# Patient Record
Sex: Female | Born: 1990 | Race: White | Hispanic: No | Marital: Single | Smoking: Never smoker
Health system: Southern US, Community
[De-identification: ages and names within clinical notes are randomized; demographics above are authoritative.]

## PROBLEM LIST (undated history)

## (undated) DIAGNOSIS — J45909 Unspecified asthma, uncomplicated: Secondary | ICD-10-CM

## (undated) DIAGNOSIS — F419 Anxiety disorder, unspecified: Secondary | ICD-10-CM

## (undated) DIAGNOSIS — N83209 Unspecified ovarian cyst, unspecified side: Secondary | ICD-10-CM

## (undated) HISTORY — DX: Anxiety disorder, unspecified: F41.9

## (undated) HISTORY — PX: TONSILLECTOMY: SUR1361

## (undated) HISTORY — DX: Unspecified ovarian cyst, unspecified side: N83.209

## (undated) HISTORY — PX: WISDOM TOOTH EXTRACTION: SHX21

## (undated) HISTORY — DX: Unspecified asthma, uncomplicated: J45.909

---

## 2008-07-16 ENCOUNTER — Emergency Department (HOSPITAL_COMMUNITY): Admission: EM | Admit: 2008-07-16 | Discharge: 2008-07-16 | Payer: Self-pay | Admitting: Emergency Medicine

## 2010-10-08 ENCOUNTER — Emergency Department (HOSPITAL_COMMUNITY)
Admission: EM | Admit: 2010-10-08 | Discharge: 2010-10-08 | Disposition: A | Discharge status: Home / Self Care | Payer: PRIVATE HEALTH INSURANCE | Attending: Emergency Medicine | Admitting: Emergency Medicine

## 2010-10-08 DIAGNOSIS — M545 Low back pain, unspecified: Secondary | ICD-10-CM | POA: Insufficient documentation

## 2010-10-08 DIAGNOSIS — N390 Urinary tract infection, site not specified: Secondary | ICD-10-CM | POA: Insufficient documentation

## 2010-10-08 LAB — URINE MICROSCOPIC-ADD ON

## 2010-10-08 LAB — URINALYSIS, ROUTINE W REFLEX MICROSCOPIC
Nitrite: NEGATIVE
Protein, ur: 30 mg/dL — AB
Specific Gravity, Urine: 1.009 (ref 1.005–1.030)
Urobilinogen, UA: 1 mg/dL (ref 0.0–1.0)

## 2010-10-08 LAB — PREGNANCY, URINE: Preg Test, Ur: NEGATIVE

## 2010-10-10 ENCOUNTER — Emergency Department (HOSPITAL_COMMUNITY)
Admission: EM | Admit: 2010-10-10 | Discharge: 2010-10-11 | Disposition: A | Discharge status: Home / Self Care | Payer: PRIVATE HEALTH INSURANCE | Attending: Emergency Medicine | Admitting: Emergency Medicine

## 2010-10-10 DIAGNOSIS — R82998 Other abnormal findings in urine: Secondary | ICD-10-CM | POA: Insufficient documentation

## 2010-10-10 DIAGNOSIS — R51 Headache: Secondary | ICD-10-CM | POA: Insufficient documentation

## 2010-10-10 DIAGNOSIS — N390 Urinary tract infection, site not specified: Secondary | ICD-10-CM | POA: Insufficient documentation

## 2010-10-11 LAB — CBC
HCT: 32.2 % — ABNORMAL LOW (ref 36.0–46.0)
Hemoglobin: 11.1 g/dL — ABNORMAL LOW (ref 12.0–15.0)
RBC: 3.39 MIL/uL — ABNORMAL LOW (ref 3.87–5.11)
RDW: 12.6 % (ref 11.5–15.5)
WBC: 15.2 10*3/uL — ABNORMAL HIGH (ref 4.0–10.5)

## 2010-10-11 LAB — URINALYSIS, ROUTINE W REFLEX MICROSCOPIC
Glucose, UA: NEGATIVE mg/dL
Glucose, UA: NEGATIVE mg/dL
Ketones, ur: NEGATIVE mg/dL
Protein, ur: NEGATIVE mg/dL
Specific Gravity, Urine: 1.022 (ref 1.005–1.030)
Urobilinogen, UA: 4 mg/dL — ABNORMAL HIGH (ref 0.0–1.0)
pH: 6 (ref 5.0–8.0)

## 2010-10-11 LAB — URINE MICROSCOPIC-ADD ON

## 2010-10-11 LAB — DIFFERENTIAL
Eosinophils Relative: 1 % (ref 0–5)
Lymphocytes Relative: 7 % — ABNORMAL LOW (ref 12–46)
Lymphs Abs: 1.1 10*3/uL (ref 0.7–4.0)
Monocytes Absolute: 1.3 10*3/uL — ABNORMAL HIGH (ref 0.1–1.0)
Monocytes Relative: 9 % (ref 3–12)
Neutro Abs: 12.6 10*3/uL — ABNORMAL HIGH (ref 1.7–7.7)

## 2010-10-11 LAB — POCT PREGNANCY, URINE: Preg Test, Ur: NEGATIVE

## 2010-10-12 LAB — URINE CULTURE: Colony Count: >=100000

## 2014-11-12 ENCOUNTER — Other Ambulatory Visit: Payer: Self-pay | Admitting: Obstetrics & Gynecology

## 2014-11-12 ENCOUNTER — Other Ambulatory Visit (HOSPITAL_COMMUNITY)
Admission: RE | Admit: 2014-11-12 | Discharge: 2014-11-12 | Disposition: A | Discharge status: Home / Self Care | Payer: BC Managed Care – PPO | Source: Ambulatory Visit | Attending: Obstetrics and Gynecology | Admitting: Obstetrics and Gynecology

## 2014-11-12 DIAGNOSIS — Z113 Encounter for screening for infections with a predominantly sexual mode of transmission: Secondary | ICD-10-CM | POA: Diagnosis present

## 2014-11-12 DIAGNOSIS — Z01419 Encounter for gynecological examination (general) (routine) without abnormal findings: Secondary | ICD-10-CM | POA: Insufficient documentation

## 2014-11-18 LAB — CYTOLOGY - PAP

## 2020-09-15 LAB — HM PAP SMEAR: HPV, high-risk: NEGATIVE

## 2021-06-24 ENCOUNTER — Other Ambulatory Visit: Payer: Self-pay

## 2021-06-24 ENCOUNTER — Encounter: Payer: Self-pay | Admitting: Family Medicine

## 2021-06-24 ENCOUNTER — Ambulatory Visit (INDEPENDENT_AMBULATORY_CARE_PROVIDER_SITE_OTHER): Payer: BC Managed Care – PPO | Admitting: Family Medicine

## 2021-06-24 VITALS — BP 120/79 | HR 90 | Temp 97.8°F | Ht 66.14 in | Wt 218.6 lb

## 2021-06-24 DIAGNOSIS — M5416 Radiculopathy, lumbar region: Secondary | ICD-10-CM | POA: Insufficient documentation

## 2021-06-24 DIAGNOSIS — G43109 Migraine with aura, not intractable, without status migrainosus: Secondary | ICD-10-CM | POA: Insufficient documentation

## 2021-06-24 DIAGNOSIS — M5442 Lumbago with sciatica, left side: Secondary | ICD-10-CM | POA: Diagnosis not present

## 2021-06-24 DIAGNOSIS — F411 Generalized anxiety disorder: Secondary | ICD-10-CM

## 2021-06-24 DIAGNOSIS — J302 Other seasonal allergic rhinitis: Secondary | ICD-10-CM | POA: Insufficient documentation

## 2021-06-24 DIAGNOSIS — N912 Amenorrhea, unspecified: Secondary | ICD-10-CM | POA: Insufficient documentation

## 2021-06-24 DIAGNOSIS — F419 Anxiety disorder, unspecified: Secondary | ICD-10-CM | POA: Insufficient documentation

## 2021-06-24 DIAGNOSIS — R5383 Other fatigue: Secondary | ICD-10-CM | POA: Diagnosis not present

## 2021-06-24 NOTE — Patient Instructions (Addendum)
[facility] in [address] [facility] in [place] [facility] [place] for sleep/anxiety.  We'll be in touch with lab results.

## 2021-06-24 NOTE — Assessment & Plan Note (Signed)
Labs per orders. Orders Placed This Encounter  Procedures   COMPLETE METABOLIC PANEL WITH GFR   CBC with Differential   TSH + free T4   B12   Vitamin D (25 hydroxy)   Iron, TIBC and Ferritin Panel   Ambulatory referral to Psychology    Referral Priority:   Routine    Referral Type:   Psychiatric    Referral Reason:   Specialty Services Required    Requested Specialty:   Psychology    Number of Visits Requested:   1

## 2021-06-24 NOTE — Progress Notes (Signed)
Joy Rogers - 30 y.o. female MRN [id number]  Date of birth: [birth year]  Subjective Chief Complaint  Patient presents with   Establish Care   Neck Pain   Sciatica    HPI Joy Rogers is a 30 year old female here today for initial visit to establish care.  She has history of anxiety as well as some low back pain with sciatica.  She has seen a therapist as well as taking medication in the past for anxiety.  She did not tolerate medication well however did provide therapy helpful.  She would like to try adding therapy back home before adding medication back on.  She has had some difficulty with sleep related to her anxiety.  She is taking melatonin as needed.  She does have side effects of vivid dreams with this.  She reports of increased fatigue would like to have labs updated.  She has had some low back pain with sciatica into the left leg as well as some neck pain.  She has seen a chiropractor for this in the past and found this to be helpful.  She is looking for chiropractors in this area.  ROS:  A comprehensive ROS was completed and negative except as noted per HPI    Allergies  Allergen Reactions   Morphine Sulfate Other (See Comments)    Causes anaphylaxis in mom   Cephalexin Hives and Nausea Only   Clindamycin/Lincomycin Hives, Rash and Other (See Comments)    Abd Pain    Past Medical History:  Diagnosis Date   Anxiety    Asthma    Ovarian cyst     Past Surgical History:  Procedure Laterality Date   TONSILLECTOMY     WISDOM TOOTH EXTRACTION      Social History   Socioeconomic History   Marital status: Single    Spouse name: Not on file   Number of children: Not on file   Years of education: Not on file   Highest education level: Not on file  Occupational History   Not on file  Tobacco Use   Smoking status: Never    Passive exposure: Never   Smokeless tobacco: Never  Vaping Use   Vaping Use: Every day   Substances: Nicotine  Substance and Sexual  Activity   Alcohol use: Yes    Alcohol/week: 10.0 standard drinks    Types: 10 Standard drinks or equivalent per week    Comment: Weekends   Drug use: Never   Sexual activity: Yes    Partners: Female, Female    Birth control/protection: Condom  Other Topics Concern   Not on file  Social History Narrative   Not on file   Social Determinants of Health   Financial Resource Strain: Not on file  Food Insecurity: Not on file  Transportation Needs: Not on file  Physical Activity: Not on file  Stress: Not on file  Social Connections: Not on file    Family History  Problem Relation Age of Onset   Hypertension Mother    Lung cancer Father    Breast cancer Maternal Aunt    Lung cancer Paternal Uncle    Breast cancer Maternal Grandmother     Health Maintenance  Topic Date Due   Pneumococcal Vaccine 71-57 Years old (1 - PCV) Never done   HIV Screening  Never done   Hepatitis C Screening  Never done   PAP SMEAR-Modifier  11/11/2017   COVID-19 Vaccine (3 - Booster for Pfizer series) 11/11/2019   TETANUS/TDAP  07/29/2026   INFLUENZA VACCINE  Completed   HPV VACCINES  Aged Out     ----------------------------------------------------------------------------------------------------------------------------------------------------------------------------------------------------------------- Physical Exam BP 120/79 (BP Location: Left Arm, Patient Position: Sitting, Cuff Size: Large)    Pulse 90    Temp 97.8 F (36.6 C)    Ht 5' 6.14" (1.68 m)    Wt 218 lb 9.6 oz (99.2 kg)    LMP 06/20/2021    SpO2 97%    BMI 35.13 kg/m   Physical Exam Constitutional:      Appearance: Normal appearance.  Eyes:     General: No scleral icterus. Cardiovascular:     Rate and Rhythm: Normal rate and regular rhythm.  Pulmonary:     Effort: Pulmonary effort is normal.     Breath sounds: Normal breath sounds.  Musculoskeletal:     Cervical back: Neck supple.  Neurological:     General: No focal  deficit present.     Mental Status: She is alert.  Psychiatric:        Mood and Affect: Mood normal.        Behavior: Behavior normal.    ------------------------------------------------------------------------------------------------------------------------------------------------------------------------------------------------------------------- Assessment and Plan  Generalized anxiety disorder Referral placed to establish with new therapist.  Other fatigue Labs per orders. Orders Placed This Encounter  Procedures   COMPLETE METABOLIC PANEL WITH GFR   CBC with Differential   TSH + free T4   B12   Vitamin D (25 hydroxy)   Iron, TIBC and Ferritin Panel   Ambulatory referral to Psychology    Referral Priority:   Routine    Referral Type:   Psychiatric    Referral Reason:   Specialty Services Required    Requested Specialty:   Psychology    Number of Visits Requested:   1     Low back pain with sciatica Given handout for home exercises.  She will look into seeing a chiropractor again as well.   No orders of the defined types were placed in this encounter.   No follow-ups on file.    This visit occurred during the SARS-CoV-2 public health emergency.  Safety protocols were in place, including screening questions prior to the visit, additional usage of staff PPE, and extensive cleaning of exam room while observing appropriate contact time as indicated for disinfecting solutions.

## 2021-06-24 NOTE — Assessment & Plan Note (Signed)
Referral placed to establish with new therapist.

## 2021-06-24 NOTE — Assessment & Plan Note (Signed)
Given handout for home exercises.  She will look into seeing a chiropractor again as well.

## 2021-06-25 LAB — VITAMIN D 25 HYDROXY (VIT D DEFICIENCY, FRACTURES): Vit D, 25-Hydroxy: 16 ng/mL — ABNORMAL LOW (ref 30–100)

## 2021-06-25 LAB — COMPLETE METABOLIC PANEL WITH GFR
AG Ratio: 1.8 (calc) (ref 1.0–2.5)
ALT: 34 U/L — ABNORMAL HIGH (ref 6–29)
AST: 32 U/L — ABNORMAL HIGH (ref 10–30)
Albumin: 4.8 g/dL (ref 3.6–5.1)
Alkaline phosphatase (APISO): 51 U/L (ref 31–125)
BUN: 13 mg/dL (ref 7–25)
CO2: 27 mmol/L (ref 20–32)
Calcium: 10.2 mg/dL (ref 8.6–10.2)
Chloride: 103 mmol/L (ref 98–110)
Creat: 0.78 mg/dL (ref 0.50–0.97)
Globulin: 2.6 g/dL (calc) (ref 1.9–3.7)
Glucose, Bld: 78 mg/dL (ref 65–99)
Potassium: 4.7 mmol/L (ref 3.5–5.3)
Sodium: 139 mmol/L (ref 135–146)
Total Bilirubin: 0.8 mg/dL (ref 0.2–1.2)
Total Protein: 7.4 g/dL (ref 6.1–8.1)
eGFR: 103 mL/min/{1.73_m2} (ref >=60)

## 2021-06-25 LAB — CBC WITH DIFFERENTIAL/PLATELET
Absolute Monocytes: 533 cells/uL (ref 200–950)
Basophils Absolute: 79 cells/uL (ref 0–200)
Basophils Relative: 1.1 %
Eosinophils Absolute: 482 cells/uL (ref 15–500)
Eosinophils Relative: 6.7 %
HCT: 43.9 % (ref 35.0–45.0)
Hemoglobin: 15 g/dL (ref 11.7–15.5)
Lymphs Abs: 2405 cells/uL (ref 850–3900)
MCH: 33.6 pg — ABNORMAL HIGH (ref 27.0–33.0)
MCHC: 34.2 g/dL (ref 32.0–36.0)
MCV: 98.2 fL (ref 80.0–100.0)
MPV: 9.9 fL (ref 7.5–12.5)
Monocytes Relative: 7.4 %
Neutro Abs: 3701 cells/uL (ref 1500–7800)
Neutrophils Relative %: 51.4 %
Platelets: 300 10*3/uL (ref 140–400)
RBC: 4.47 10*6/uL (ref 3.80–5.10)
RDW: 11.8 % (ref 11.0–15.0)
Total Lymphocyte: 33.4 %
WBC: 7.2 10*3/uL (ref 3.8–10.8)

## 2021-06-25 LAB — IRON,TIBC AND FERRITIN PANEL
%SAT: 28 % (calc) (ref 16–45)
Ferritin: 84 ng/mL (ref 16–154)
Iron: 115 ug/dL (ref 40–190)
TIBC: 415 mcg/dL (calc) (ref 250–450)

## 2021-06-25 LAB — TSH+FREE T4: TSH W/REFLEX TO FT4: 1.05 mIU/L

## 2021-06-25 LAB — VITAMIN B12: Vitamin B-12: 313 pg/mL (ref 200–1100)

## 2021-06-29 ENCOUNTER — Other Ambulatory Visit: Payer: Self-pay | Admitting: Family Medicine

## 2021-06-29 MED ORDER — VITAMIN D (ERGOCALCIFEROL) 1.25 MG (50000 UNIT) PO CAPS: 50000.0000 [IU] | ORAL_CAPSULE | ORAL | 0 refills | Status: DC

## 2021-07-19 ENCOUNTER — Ambulatory Visit (INDEPENDENT_AMBULATORY_CARE_PROVIDER_SITE_OTHER): Payer: BC Managed Care – PPO

## 2021-07-19 ENCOUNTER — Ambulatory Visit: Payer: BC Managed Care – PPO | Admitting: Family Medicine

## 2021-07-19 ENCOUNTER — Encounter: Payer: Self-pay | Admitting: Family Medicine

## 2021-07-19 ENCOUNTER — Other Ambulatory Visit: Payer: Self-pay

## 2021-07-19 VITALS — BP 134/80 | HR 71 | Ht 66.0 in | Wt 226.0 lb

## 2021-07-19 DIAGNOSIS — M5416 Radiculopathy, lumbar region: Secondary | ICD-10-CM | POA: Diagnosis not present

## 2021-07-19 DIAGNOSIS — M5442 Lumbago with sciatica, left side: Secondary | ICD-10-CM | POA: Diagnosis not present

## 2021-07-19 MED ORDER — PREDNISONE 50 MG PO TABS: ORAL_TABLET | ORAL | 0 refills | Status: DC

## 2021-07-19 MED ORDER — GABAPENTIN 300 MG PO CAPS: ORAL_CAPSULE | ORAL | 3 refills | Status: DC

## 2021-07-19 NOTE — Assessment & Plan Note (Addendum)
X-rays of lumbar spine ordered.  Referral placed to physical therapy.  Adding 5-day course of prednisone 50 mg daily.  We will also add gabapentin as needed, given instructions on titration of this.  We will plan to follow-up in a few weeks or sooner if worsening.

## 2021-07-19 NOTE — Patient Instructions (Signed)
Start prednisone daily.  Use gabapentin 300mg  at night.  You may use up to 900mg  at night if needed.  You may also take 300mg  during the day as well.

## 2021-07-19 NOTE — Progress Notes (Signed)
Joy Rogers - 31 y.o. female MRN [id number]  Date of birth: [birth year]  Subjective Chief Complaint  Patient presents with   Leg Pain    HPI Joy Rogers is a 31 year old female here today with complaint of low back pain.  She has having low back pain at previous appointment which has worsened despite home exercises and chiropractic care.  She is having radiation down the right leg.  Positional changes seem to make this worse.  She denies numbness, tingling or weakness of the leg.  She has not had foot drop, bowel or bladder incontinence.  ROS:  A comprehensive ROS was completed and negative except as noted per HPI  Allergies  Allergen Reactions   Morphine Sulfate Other (See Comments)    Causes anaphylaxis in mom   Cephalexin Hives and Nausea Only   Clindamycin/Lincomycin Hives, Rash and Other (See Comments)    Abd Pain    Past Medical History:  Diagnosis Date   Anxiety    Asthma    Ovarian cyst     Past Surgical History:  Procedure Laterality Date   TONSILLECTOMY     WISDOM TOOTH EXTRACTION      Social History   Socioeconomic History   Marital status: Single    Spouse name: Not on file   Number of children: Not on file   Years of education: Not on file   Highest education level: Not on file  Occupational History   Not on file  Tobacco Use   Smoking status: Never    Passive exposure: Never   Smokeless tobacco: Never  Vaping Use   Vaping Use: Every day   Substances: Nicotine  Substance and Sexual Activity   Alcohol use: Yes    Alcohol/week: 10.0 standard drinks    Types: 10 Standard drinks or equivalent per week    Comment: Weekends   Drug use: Never   Sexual activity: Yes    Partners: Female, Female    Birth control/protection: Condom  Other Topics Concern   Not on file  Social History Narrative   Not on file   Social Determinants of Health   Financial Resource Strain: Not on file  Food Insecurity: Not on file  Transportation Needs: Not on file   Physical Activity: Not on file  Stress: Not on file  Social Connections: Not on file    Family History  Problem Relation Age of Onset   Hypertension Mother    Lung cancer Father    Breast cancer Maternal Aunt    Lung cancer Paternal Uncle    Breast cancer Maternal Grandmother     Health Maintenance  Topic Date Due   HIV Screening  Never done   Hepatitis C Screening  Never done   PAP SMEAR-Modifier  11/11/2017   COVID-19 Vaccine (3 - Booster for Pfizer series) 11/11/2019   TETANUS/TDAP  07/29/2026   INFLUENZA VACCINE  Completed   HPV VACCINES  Aged Out     ----------------------------------------------------------------------------------------------------------------------------------------------------------------------------------------------------------------- Physical Exam BP 134/80 (BP Location: Left Arm, Patient Position: Sitting, Cuff Size: Normal)    Pulse 71    Ht 5\' 6"  (1.676 m)    Wt 226 lb (102.5 kg)    LMP 06/20/2021 Comment: on bc   SpO2 100%    BMI 36.48 kg/m   Physical Exam Constitutional:      Appearance: Normal appearance.  Musculoskeletal:     Comments: Range of motion of lumbar spine is fairly normal with increased pain on extension of the spine. Negative  straight leg raise with mild pain on FABER testing. Strength of lower extremities is normal.  Neurological:     Mental Status: She is alert.    ------------------------------------------------------------------------------------------------------------------------------------------------------------------------------------------------------------------- Assessment and Plan  Low back pain with sciatica X-rays of lumbar spine ordered.  Referral placed to physical therapy.  Adding 5-day course of prednisone 50 mg daily.  We will also add gabapentin as needed, given instructions on titration of this.  We will plan to follow-up in a few weeks or sooner if worsening.   Meds ordered this encounter   Medications   gabapentin (NEURONTIN) 300 MG capsule    Sig: Take 300mg  qhs x1 week.  May increase to 300mg  TID if needed.    Dispense:  90 capsule    Refill:  3   predniSONE (DELTASONE) 50 MG tablet    Sig: Take 1 tab po daily x5 days.    Dispense:  5 tablet    Refill:  0    No follow-ups on file.    This visit occurred during the SARS-CoV-2 public health emergency.  Safety protocols were in place, including screening questions prior to the visit, additional usage of staff PPE, and extensive cleaning of exam room while observing appropriate contact time as indicated for disinfecting solutions.

## 2021-07-21 ENCOUNTER — Encounter: Payer: Self-pay | Admitting: Family Medicine

## 2021-08-09 ENCOUNTER — Other Ambulatory Visit: Payer: Self-pay | Admitting: Family Medicine

## 2021-08-09 ENCOUNTER — Encounter: Payer: Self-pay | Admitting: Family Medicine

## 2021-08-09 MED ORDER — PREDNISONE 10 MG (48) PO TBPK: ORAL_TABLET | ORAL | 0 refills | Status: DC

## 2021-08-12 ENCOUNTER — Other Ambulatory Visit: Payer: Self-pay

## 2021-08-12 ENCOUNTER — Encounter: Payer: Self-pay | Admitting: Rehabilitative and Restorative Service Providers"

## 2021-08-12 ENCOUNTER — Ambulatory Visit: Payer: BC Managed Care – PPO | Attending: Family Medicine | Admitting: Rehabilitative and Restorative Service Providers"

## 2021-08-12 DIAGNOSIS — R29898 Other symptoms and signs involving the musculoskeletal system: Secondary | ICD-10-CM | POA: Insufficient documentation

## 2021-08-12 DIAGNOSIS — M6281 Muscle weakness (generalized): Secondary | ICD-10-CM | POA: Insufficient documentation

## 2021-08-12 DIAGNOSIS — M5416 Radiculopathy, lumbar region: Secondary | ICD-10-CM | POA: Insufficient documentation

## 2021-08-12 NOTE — Therapy (Signed)
[facility] [address] [place], [place], [address] Phone: [phone]   Fax:  [phone]  Physical Therapy Evaluation  Patient Details  Name: Joy Rogers MRN: [id number] Date of Birth: Sep 17, 1990 Referring Provider (PT): Dr Everrett Coombe   Encounter Date: 08/12/2021   PT End of Session - 08/12/21 1740     Visit Number 1    Number of Visits 12    Date for PT Re-Evaluation 09/23/21    PT Start Time 1640    PT Stop Time 1740    PT Time Calculation (min) 60 min    Activity Tolerance Patient tolerated treatment well             Past Medical History:  Diagnosis Date   Anxiety    Asthma    Ovarian cyst     Past Surgical History:  Procedure Laterality Date   TONSILLECTOMY     WISDOM TOOTH EXTRACTION      There were no vitals filed for this visit.    Subjective Assessment - 08/12/21 1627     Subjective Patient reports that she has been having LBP since early December. She has had some on and off pain in the low back and into the Rt LE through the posterior hip and into the Lt LE to calf. She was seen by MD and treated with medication with some improvement.    Pertinent History denies any other medical problems or similar episodes of LBP in the past    Currently in Pain? Yes    Pain Score 6    sharp pain to 7/10   Pain Location Back    Pain Orientation Right;Lower    Pain Descriptors / Indicators Shooting;Aching;Spasm    Pain Type Acute pain    Pain Radiating Towards Rt LB to posterior hip to thigh and calf    Pain Onset More than a month ago    Pain Frequency Intermittent   present most of the day   Aggravating Factors  sitting; getting in and out of the car; reaching; certain movements    Pain Relieving Factors standing; meds some help; heat short periods of time                [facility] PT Assessment - 08/12/21 0001       Assessment   Medical Diagnosis Lumbar radiculopathy    Referring  Provider (PT) Dr Everrett Coombe    Onset Date/Surgical Date 05/27/21    Hand Dominance Right    Next MD Visit to schedule    Prior Therapy chiropractic upper back 2020      Precautions   Precautions None      Restrictions   Weight Bearing Restrictions No      Balance Screen   Has the patient fallen in the past 6 months No    Has the patient had a decrease in activity level because of a fear of falling?  No    Is the patient reluctant to leave their home because of a fear of falling?  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; coaches volleyball in the spring x 8 yrs    Leisure gym 4-5 days/wk prior to LBP for 1-2 hours - cardio and free wts and machines      Observation/Other Assessments   Focus on Therapeutic Outcomes (FOTO)  34      Sensation   Additional Comments denies any numbness or tingling      Posture/Postural Control   Posture Comments sits with wt shifted to the Lt; shands with wt shifted to the Lt, hips flexed      AROM   Lumbar Flexion 100%    Lumbar Extension 60% discomfort LB central    Lumbar - Right Side Bend 70% increased pain in Rt LE    Lumbar - Left Side Bend 75% no change in pain    Lumbar - Right Rotation 50%    Lumbar - Left Rotation 50%      Strength   Overall Strength Comments WFL's not tested resistively      Flexibility   Hamstrings tight Rt    Quadriceps tight Rt > Lt    ITB tight Rt    Piriformis tight Rt      Palpation   Spinal mobility hypomobile lumbar spine    Palpation comment muscular tightness through Rt posterior hip in the piriformis, gluts, ITB into hip flexors       Special Tests   Other special tests (-) SLR      Ambulation/Gait   Gait Comments antalgic gait pattern with limp Rt LE                        Objective measurements completed on examination: See above findings.       [facility] Adult PT Treatment/Exercise - 08/12/21 0001       Self-Care   Self-Care Other Self-Care Comments    Other Self-Care Comments  discussion of best positions for rest, sleep      Therapeutic Activites    Therapeutic Activities Other Therapeutic Activities    Other Therapeutic Activities initiated back care education      Lumbar Exercises: Stretches   Passive Hamstring Stretch Right;2 reps;30 seconds    Passive Hamstring Stretch Limitations supine with strap    Standing Extension 3 reps    Standing Extension Limitations 2-3 sec    Prone on Elbows Stretch 2 reps;60 seconds    Press Ups 10 reps    Press Ups Limitations 2-3 sec    ITB Stretch Right;2 reps;30 seconds    ITB Stretch Limitations supine with strap    Piriformis Stretch Right;2 reps;30 seconds    Piriformis Stretch Limitations travell supine with strap      Lumbar Exercises: Supine   AB Set Limitations 4 part core 10 sec x 10 reps      Moist Heat Therapy   Number Minutes Moist Heat 10 Minutes    Moist Heat Location Lumbar Spine;Hip      Electrical Stimulation   Electrical Stimulation Location Rt posterior hip to lateral thigh    Electrical Stimulation Action TENs    Electrical Stimulation Parameters to tolerance    Electrical Stimulation Goals Pain;Tone      Manual Therapy   Manual therapy comments skilled palpation to assess response to DN and manual work    Joint Mobilization PA mobs through the Rt greater trochanter    Soft tissue mobilization deep tissue work through the posterior Rt  hip in piriformis and gluts    Myofascial Release posterior Rt hip              Trigger Point Dry Needling - 08/12/21 0001     Consent Given? Yes    Education Handout Provided Yes     Dry Needling Comments Rt    Gluteus Medius Response Palpable increased muscle length;Twitch response elicited    Gluteus Maximus Response Palpable increased muscle length;Twitch response elicited    Piriformis Response Palpable increased muscle length;Twitch response elicited                   PT Education - 08/12/21 1718     Education Details POC HEP posture desk ergonomics DN TENS    Person(s) Educated Patient    Methods Explanation;Demonstration;Tactile cues;Verbal cues;Handout    Comprehension Verbalized understanding;Returned demonstration;Verbal cues required;Tactile cues required                 PT Long Term Goals - 08/12/21 1753       PT LONG TERM GOAL #1   Title Improve core strength and stability allowing patient to perform 30-45 min of exercise with no pain    Time 6    Period Weeks    Status New    Target Date 09/23/21      PT LONG TERM GOAL #2   Title Decrease pain in LB and Rt LE pain by 75-100% allowing patient to return to all functional activities    Time 6    Period Weeks    Status New    Target Date 09/23/21      PT LONG TERM GOAL #3   Title Patient to demonstrate and/or verbalize proper transfers; postures/positions; lifting for back care    Time 6    Period Weeks    Status New    Target Date 09/23/21      PT LONG TERM GOAL #4   Title Independent in HEP includig aquatic program as indicated    Time 6    Period Weeks    Status New    Target Date 09/23/21      PT LONG TERM GOAL #5   Title Improve functional limitation score to 58    Time 6    Period Weeks    Status New    Target Date 09/23/21                    Plan - 08/12/21 1741     Clinical Impression Statement Patient presents with ~ 2 -3 month history of low back pain and Rt LE pain on an intermittent basis. Symptoms have increased in the past 6 weeks with patient reporting increased pain in the Rt LE posterior hip to thigh to calf. She has poor posture and  alignment; limited trunk and LE mobility; poor core strength; poor movement patterns; antalgic gait; muscular tightness to palpation; radicular pain as well as LBP on a daily basis. Patient will benefit from PT to address problems identified.    Stability/Clinical Decision Making Stable/Uncomplicated    Clinical Decision Making Low    Rehab Potential Good    PT Frequency 2x / week    PT Duration 6 weeks    PT Treatment/Interventions ADLs/Self Care Home Management;Aquatic Therapy;Cryotherapy;Electrical Stimulation;Iontophoresis 4mg /ml Dexamethasone;Moist Heat;Ultrasound;Gait training;Stair training;Functional mobility training;Therapeutic activities;Therapeutic exercise;Balance training;Neuromuscular re-education;Patient/family education;Dry needling;Taping    PT Next Visit Plan review HEP; progress with stretching for hip flexors; extension program; core stabilization; DN vs  manual work; assess response to DN; modalities as indicated    PT Home Exercise Plan [email or web address]    Consulted and Agree with Plan of Care Patient             Patient will benefit from skilled therapeutic intervention in order to improve the following deficits and impairments:  Decreased range of motion, Decreased activity tolerance, Pain, Impaired flexibility, Improper body mechanics, Decreased mobility, Decreased strength, Postural dysfunction, Abnormal gait  Visit Diagnosis: Radiculopathy, lumbar region  Muscle weakness (generalized)  Other symptoms and signs involving the musculoskeletal system     Problem List Patient Active Problem List   Diagnosis Date Noted   Migraine with aura 06/24/2021   Generalized anxiety disorder 06/24/2021   Other seasonal allergic rhinitis 06/24/2021   Anxiety 06/24/2021   Amenorrhea 06/24/2021   Other fatigue 06/24/2021   Low back pain with sciatica 06/24/2021    Dacari Beckstrand Rober Minion, PT, MPH  08/12/2021, 5:59 PM  [facility] [address]  [address] [place], [place], [address] Phone: [phone]   Fax:  [phone]  Name: Joy Rogers MRN: [id number] Date of Birth: Jan 17, 1991

## 2021-08-12 NOTE — Patient Instructions (Signed)
Access Code: [email or web address] URL: [email or web address] Date: 08/12/2021 Prepared by: Gillermo Murdoch  Exercises Prone Press Up - 2 x daily - 7 x weekly - 1 sets - 10 reps - 2-3 sec hold Prone Press Up On Elbows - 2 x daily - 7 x weekly - 1 sets - 3 reps - 30 sec hold Standing Back Extension - 2 x daily - 7 x weekly - 1 sets - 3 reps - 3-5 sec hold Supine Piriformis Stretch with Leg Straight - 2 x daily - 7 x weekly - 1 sets - 3 reps - 30 sec hold Hooklying Hamstring Stretch with Strap - 2 x daily - 7 x weekly - 1 sets - 3 reps - 30 sec hold Supine ITB Stretch with Strap - 2 x daily - 7 x weekly - 1 sets - 3 reps - 30 sec hold Supine Transversus Abdominis Bracing with Pelvic Floor Contraction - 2 x daily - 7 x weekly - 1 sets - 10 reps - 10sec hold  Patient Education Biomedical scientist Office Posture Trigger Point Dry Needling TENS Unit

## 2021-08-16 ENCOUNTER — Ambulatory Visit: Payer: BC Managed Care – PPO | Admitting: Rehabilitative and Restorative Service Providers"

## 2021-08-16 ENCOUNTER — Encounter: Payer: Self-pay | Admitting: Rehabilitative and Restorative Service Providers"

## 2021-08-16 ENCOUNTER — Other Ambulatory Visit: Payer: Self-pay

## 2021-08-16 DIAGNOSIS — R29898 Other symptoms and signs involving the musculoskeletal system: Secondary | ICD-10-CM

## 2021-08-16 DIAGNOSIS — M5416 Radiculopathy, lumbar region: Secondary | ICD-10-CM | POA: Diagnosis not present

## 2021-08-16 DIAGNOSIS — M6281 Muscle weakness (generalized): Secondary | ICD-10-CM

## 2021-08-16 NOTE — Patient Instructions (Addendum)
Access Code: [email or web address] URL: [email or web address] Date: 08/16/2021 Prepared by: Gillermo Murdoch  Exercises Prone Press Up - 2 x daily - 7 x weekly - 1 sets - 10 reps - 2-3 sec hold Prone Press Up On Elbows - 2 x daily - 7 x weekly - 1 sets - 3 reps - 30 sec hold Standing Back Extension - 2 x daily - 7 x weekly - 1 sets - 3 reps - 3-5 sec hold Supine Piriformis Stretch with Leg Straight - 2 x daily - 7 x weekly - 1 sets - 3 reps - 30 sec hold Hooklying Hamstring Stretch with Strap - 2 x daily - 7 x weekly - 1 sets - 3 reps - 30 sec hold Supine ITB Stretch with Strap - 2 x daily - 7 x weekly - 1 sets - 3 reps - 30 sec hold Supine Transversus Abdominis Bracing with Pelvic Floor Contraction - 2 x daily - 7 x weekly - 1 sets - 10 reps - 10sec hold Sit to Stand - 2 x daily - 7 x weekly - 1 sets - 10 reps - 3-5 sec hold Seated Hip Flexor Stretch - 2 x daily - 7 x weekly - 1 sets - 3 reps - 30 sec hold Seated Hamstring Stretch - 2 x daily - 7 x weekly - 1 sets - 3 reps - 30 sec hold

## 2021-08-16 NOTE — Therapy (Signed)
[place], [place], [address] Phone: [phone]   Fax:  [phone]  Physical Therapy Treatment  Patient Details  Name: Joy Rogers MRN: [id number] Date of Birth: 1990/12/01 Referring Provider (PT): Dr Luetta Nutting   Encounter Date: 08/16/2021   PT End of Session - 08/16/21 0713     Visit Number 2    Number of Visits 12    Date for PT Re-Evaluation 09/23/21    PT Start Time 0713    PT Stop Time 0801    PT Time Calculation (min) 48 min    Activity Tolerance Patient tolerated treatment well             Past Medical History:  Diagnosis Date   Anxiety    Asthma    Ovarian cyst     Past Surgical History:  Procedure Laterality Date   TONSILLECTOMY     WISDOM TOOTH EXTRACTION      There were no vitals filed for this visit.   Subjective Assessment - 08/16/21 0714     Subjective Patient reports that she has good relief following treatment last visit. She felt OK Friday and Saturday but worse again yesterday. She was sitting more and cleaned her house yesterday.    Currently in Pain? Yes    Pain Score 6    sharp pain 7/10   Pain Location Back    Pain Orientation Right;Lower    Pain Descriptors / Indicators Shooting;Aching;Spasm    Pain Type Acute pain    Pain Onset More than a month ago    Pain Frequency Intermittent                               [facility] Adult PT Treatment/Exercise - 08/16/21 0001       Self-Care   Other Self-Care Comments  sitting posture and modification of chairs      Therapeutic Activites    Other Therapeutic Activities continue with back care education      Lumbar Exercises: Stretches   Active Hamstring Stretch Limitations sitting hamstring stretch 30 sec x 2 reps    Passive Hamstring Stretch Right;2 reps;30 seconds    Passive Hamstring Stretch Limitations supine with strap    Standing Extension 3 reps    Standing Extension  Limitations 2-3 sec    Prone on Elbows Stretch 2 reps;60 seconds    Press Ups 10 reps    Press Ups Limitations 2-3 sec    ITB Stretch Right;1 rep;30 seconds    ITB Stretch Limitations supine with strap    Piriformis Stretch Right;2 reps;30 seconds    Piriformis Stretch Limitations travell supine with strap    Other Lumbar Stretch Exercise hip flexor stretch 30 sec x 2 reps each side sitting      Lumbar Exercises: Seated   Sit to Stand 10 reps    Sit to Stand Limitations VC to engage core and hinge hips      Lumbar Exercises: Supine   AB Set Limitations 4 part core 10 sec x 10 reps      Moist Heat Therapy   Number Minutes Moist Heat 10 Minutes    Moist Heat Location Lumbar Spine;Hip      Electrical Stimulation   Electrical Stimulation Location Rt posterior hip    Electrical Stimulation Action mAmp x 5 min    Electrical Stimulation Parameters to tolerance    Electrical Stimulation  Goals Pain;Tone      Manual Therapy   Manual therapy comments skilled palpation to assess response to DN and manual work    Joint Mobilization PA mobs through the [place] greater trochanter    Soft tissue mobilization deep tissue work through the posterior Rt hip in piriformis and gluts    Myofascial Release posterior Rt hip              Trigger Point Dry Needling - 08/16/21 0001     Consent Given? Yes    Education Handout Provided Previously provided    Dry Needling Comments Rt    Electrical Stimulation Performed with Dry Needling Yes    E-stim with Dry Needling Details mAmp x 5 min    Gluteus Medius Response Palpable increased muscle length;Twitch response elicited    Gluteus Maximus Response Palpable increased muscle length;Twitch response elicited    Piriformis Response Palpable increased muscle length;Twitch response elicited                   PT Education - 08/16/21 0735     Education Details HEP    Person(s) Educated Patient    Methods Explanation;Demonstration;Tactile  cues;Verbal cues;Handout    Comprehension Verbalized understanding;Returned demonstration;Verbal cues required;Tactile cues required                 PT Long Term Goals - 08/12/21 1753       PT LONG TERM GOAL #1   Title Improve core strength and stability allowing patient to perform 30-45 min of exercise with no pain    Time 6    Period Weeks    Status New    Target Date 09/23/21      PT LONG TERM GOAL #2   Title Decrease pain in LB and Rt LE pain by 75-100% allowing patient to return to all functional activities    Time 6    Period Weeks    Status New    Target Date 09/23/21      PT LONG TERM GOAL #3   Title Patient to demonstrate and/or verbalize proper transfers; postures/positions; lifting for back care    Time 6    Period Weeks    Status New    Target Date 09/23/21      PT LONG TERM GOAL #4   Title Independent in HEP includig aquatic program as indicated    Time 6    Period Weeks    Status New    Target Date 09/23/21      PT LONG TERM GOAL #5   Title Improve functional limitation score to 58    Time 6    Period Weeks    Status New    Target Date 09/23/21                   Plan - 08/16/21 0718     Clinical Impression Statement Positive response to DN an exercises with good improvement over the following two days. Noted increased pain yesterday. Working on her exercises. Addressed sitting posture and alignment using swim noodles. Added hip flexor stretch in sitting and sit to stand core engaged. Encouraged good body mechanics and to avoid prolonged sitting.    Rehab Potential Good    PT Frequency 2x / week    PT Duration 6 weeks    PT Treatment/Interventions ADLs/Self Care Home Management;Aquatic Therapy;Cryotherapy;Electrical Stimulation;Iontophoresis 4mg /ml Dexamethasone;Moist Heat;Ultrasound;Gait training;Stair training;Functional mobility training;Therapeutic activities;Therapeutic exercise;Balance training;Neuromuscular  re-education;Patient/family education;Dry needling;Taping    PT  Next Visit Plan review and progress HEP; progress with stretching for hip flexors; extension program; core stabilization; DN vs manual work; continue DN and manual work; modalities as indicated    [place]             Patient will benefit from skilled therapeutic intervention in order to improve the following deficits and impairments:     Visit Diagnosis: Radiculopathy, lumbar region  Muscle weakness (generalized)  Other symptoms and signs involving the musculoskeletal system     Problem List Patient Active Problem List   Diagnosis Date Noted   Migraine with aura 06/24/2021   Generalized anxiety disorder 06/24/2021   Other seasonal allergic rhinitis 06/24/2021   Anxiety 06/24/2021   Amenorrhea 06/24/2021   Other fatigue 06/24/2021   Low back pain with sciatica 06/24/2021    Revanth Neidig Nilda Simmer, PT, MPH  08/16/2021, 7:56 AM  [facility] [place], [place], [address] Phone: [phone]   Fax:  [phone]  Name: Joy Rogers MRN: [id number] Date of Birth: 10-26-1990

## 2021-08-18 ENCOUNTER — Encounter: Payer: BC Managed Care – PPO | Admitting: Rehabilitative and Restorative Service Providers"

## 2021-08-19 ENCOUNTER — Ambulatory Visit: Payer: BC Managed Care – PPO | Admitting: Physical Therapy

## 2021-08-19 ENCOUNTER — Encounter: Payer: Self-pay | Admitting: Physical Therapy

## 2021-08-19 ENCOUNTER — Other Ambulatory Visit: Payer: Self-pay

## 2021-08-19 DIAGNOSIS — M6281 Muscle weakness (generalized): Secondary | ICD-10-CM

## 2021-08-19 DIAGNOSIS — R29898 Other symptoms and signs involving the musculoskeletal system: Secondary | ICD-10-CM

## 2021-08-19 DIAGNOSIS — M5416 Radiculopathy, lumbar region: Secondary | ICD-10-CM | POA: Diagnosis not present

## 2021-08-19 NOTE — Therapy (Signed)
[facility] [address] [place], [place], [address] Phone: [phone]   Fax:  [phone]  Physical Therapy Treatment  Patient Details  Name: Joy Rogers MRN: [id number] Date of Birth: [birth year] Referring Provider (PT): Dr Everrett Coombe   Encounter Date: 08/19/2021   PT End of Session - 08/19/21 1705     Visit Number 3    Number of Visits 12    Date for PT Re-Evaluation 09/23/21    PT Start Time 1705    PT Stop Time 1750    PT Time Calculation (min) 45 min    Activity Tolerance Patient tolerated treatment well    Behavior During Therapy [facility] for tasks assessed/performed             Past Medical History:  Diagnosis Date   Anxiety    Asthma    Ovarian cyst     Past Surgical History:  Procedure Laterality Date   TONSILLECTOMY     WISDOM TOOTH EXTRACTION      There were no vitals filed for this visit.   Subjective Assessment - 08/19/21 1712     Subjective pt reports that her Rt hip was more irritated after estim with DN. She has been trying to sit up straight more.  She is changing positions more often and stretching daily.    Currently in Pain? Yes    Pain Score 4     Pain Location Hip    Pain Orientation Right    Pain Descriptors / Indicators Aching    Pain Radiating Towards posterior/ lateral hip to knee    Aggravating Factors  transitional moves    Pain Relieving Factors meds, heat                [facility] PT Assessment - 08/19/21 0001       Assessment   Medical Diagnosis Lumbar radiculopathy    Referring Provider (PT) Dr Everrett Coombe    Onset Date/Surgical Date 05/27/21    Hand Dominance Right    Next MD Visit to schedule    Prior Therapy chiropractic upper back 2020               [facility] Adult PT Treatment/Exercise - 08/19/21 0001       Lumbar Exercises: Stretches   Hip Flexor Stretch Right;2 reps;30 seconds    Hip Flexor Stretch Limitations thomas position with opp knee to  chest    Press Ups 5 reps    ITB Stretch Right;1 rep;30 seconds    ITB Stretch Limitations supine with strap    Piriformis Stretch Right;1 rep;30 seconds    Piriformis Stretch Limitations travell supine with strap    Other Lumbar Stretch Exercise RLE nerve glides with hip flexed at 90, knee flexed 90    Other Lumbar Stretch Exercise modified pigeon pose x 20 sec x 2      Lumbar Exercises: Aerobic   Tread Mill 1.5 mph, 6 min for warm up    Nustep L4: legs only x 1 min; not tolerated      Lumbar Exercises: Seated   Sit to Stand 5 reps   core engaged.     Lumbar Exercises: Prone   Other Prone Lumbar Exercises glute sets x 5 sec x 5      Modalities   Modalities --   deferred; will use at home.     Manual Therapy   Manual Therapy Taping    Soft tissue mobilization deep tissue work through  the posterior Rt hip in piriformis and glutes    Myofascial Release posterior Rt hip    Kinesiotex Edema      Kinesiotix   Edema Star shape of reg Rock tape applied to Rt lateral hip.                     PT Education - 08/19/21 1758     Education Details verbally added supine hip flexor stretch.  given safe removal instructions for ktape.    Person(s) Educated Patient    Methods Explanation    Comprehension Verbalized understanding                 PT Long Term Goals - 08/12/21 1753       PT LONG TERM GOAL #1   Title Improve core strength and stability allowing patient to perform 30-45 min of exercise with no pain    Time 6    Period Weeks    Status New    Target Date 09/23/21      PT LONG TERM GOAL #2   Title Decrease pain in LB and Rt LE pain by 75-100% allowing patient to return to all functional activities    Time 6    Period Weeks    Status New    Target Date 09/23/21      PT LONG TERM GOAL #3   Title Patient to demonstrate and/or verbalize proper transfers; postures/positions; lifting for back care    Time 6    Period Weeks    Status New    Target  Date 09/23/21      PT LONG TERM GOAL #4   Title Independent in HEP includig aquatic program as indicated    Time 6    Period Weeks    Status New    Target Date 09/23/21      PT LONG TERM GOAL #5   Title Improve functional limitation score to 58    Time 6    Period Weeks    Status New    Target Date 09/23/21                   Plan - 08/19/21 1725     Clinical Impression Statement Gradual improvement in symptoms in RLE.  Pt did not tolerate NuStep, but did tolerate walking on treadmill.  Thomas position hip flexor stretch tolerated better than seated. All other stretches are tolerated well.  Progressing towards goals.    Rehab Potential Good    PT Frequency 2x / week    PT Duration 6 weeks    PT Treatment/Interventions ADLs/Self Care Home Management;Aquatic Therapy;Cryotherapy;Electrical Stimulation;Iontophoresis 4mg /ml Dexamethasone;Moist Heat;Ultrasound;Gait training;Stair training;Functional mobility training;Therapeutic activities;Therapeutic exercise;Balance training;Neuromuscular re-education;Patient/family education;Dry needling;Taping    PT Next Visit Plan progress HEP; progress with stretching for hip flexors; extension program; core stabilization; DN vs manual work; continue DN and manual work; modalities as indicated    [place] and Agree with Plan of Care Patient             Patient will benefit from skilled therapeutic intervention in order to improve the following deficits and impairments:  Decreased range of motion, Decreased activity tolerance, Pain, Impaired flexibility, Improper body mechanics, Decreased mobility, Decreased strength, Postural dysfunction, Abnormal gait  Visit Diagnosis: Radiculopathy, lumbar region  Muscle weakness (generalized)  Other symptoms and signs involving the musculoskeletal system     Problem List Patient Active Problem List  Diagnosis Date Noted   Migraine with aura 06/24/2021    Generalized anxiety disorder 06/24/2021   Other seasonal allergic rhinitis 06/24/2021   Anxiety 06/24/2021   Amenorrhea 06/24/2021   Other fatigue 06/24/2021   Low back pain with sciatica 06/24/2021   Kerin Perna, PTA 08/19/21 6:03 PM   [place], [place], [address] Phone: [phone]   Fax:  [phone]  Name: Kassey Khaled MRN: [id number] Date of Birth: Dec 30, 1990

## 2021-08-24 ENCOUNTER — Encounter: Payer: Self-pay | Admitting: Family Medicine

## 2021-08-24 MED ORDER — TIZANIDINE HCL 4 MG PO TABS: 4.0000 mg | ORAL_TABLET | Freq: Four times a day (QID) | ORAL | 0 refills | Status: DC | PRN

## 2021-08-24 MED ORDER — NAPROXEN 500 MG PO TABS: 500.0000 mg | ORAL_TABLET | Freq: Two times a day (BID) | ORAL | 0 refills | Status: DC

## 2021-08-25 ENCOUNTER — Encounter: Payer: Self-pay | Admitting: Rehabilitative and Restorative Service Providers"

## 2021-08-25 ENCOUNTER — Other Ambulatory Visit: Payer: Self-pay

## 2021-08-25 ENCOUNTER — Ambulatory Visit: Payer: BC Managed Care – PPO | Attending: Family Medicine | Admitting: Rehabilitative and Restorative Service Providers"

## 2021-08-25 DIAGNOSIS — M6281 Muscle weakness (generalized): Secondary | ICD-10-CM | POA: Insufficient documentation

## 2021-08-25 DIAGNOSIS — R29898 Other symptoms and signs involving the musculoskeletal system: Secondary | ICD-10-CM | POA: Insufficient documentation

## 2021-08-25 DIAGNOSIS — M5416 Radiculopathy, lumbar region: Secondary | ICD-10-CM | POA: Diagnosis not present

## 2021-08-25 NOTE — Patient Instructions (Addendum)
?  Access Code: [email or web address] ?URL: [email or web address] ?Date: 08/25/2021 ?Prepared by: Corlis Leak ? ?Exercises ?Prone Press Up - 2 x daily - 7 x weekly - 1 sets - 10 reps - 2-3 sec hold ?Prone Press Up On Elbows - 2 x daily - 7 x weekly - 1 sets - 3 reps - 30 sec hold ?Standing Back Extension - 2 x daily - 7 x weekly - 1 sets - 3 reps - 3-5 sec hold ?Supine Piriformis Stretch with Leg Straight - 2 x daily - 7 x weekly - 1 sets - 3 reps - 30 sec hold ?Hooklying Hamstring Stretch with Strap - 2 x daily - 7 x weekly - 1 sets - 3 reps - 30 sec hold ?Supine ITB Stretch with Strap - 2 x daily - 7 x weekly - 1 sets - 3 reps - 30 sec hold ?Supine Transversus Abdominis Bracing with Pelvic Floor Contraction - 2 x daily - 7 x weekly - 1 sets - 10 reps - 10sec hold ?Sit to Stand - 2 x daily - 7 x weekly - 1 sets - 10 reps - 3-5 sec hold ?Seated Hamstring Stretch - 2 x daily - 7 x weekly - 1 sets - 3 reps - 30 sec hold ?Wall Quarter Squat - 2 x daily - 7 x weekly - 1-2 sets - 10 reps - 5-10 sec hold ?Standing Bilateral Low Shoulder Row with Anchored Resistance - 2 x daily - 7 x weekly - 1-3 sets - 10 reps - 2-3 sec hold ?Anti-Rotation Lateral Stepping with Press - 2 x daily - 7 x weekly - 1-2 sets - 10 reps - 2-3 sec hold ?Half Deadlift with Kettlebell - 2 x daily - 7 x weekly - 1 sets - 5-10 reps ?Hip Flexor Stretch at Edge of Bed - 2 x daily - 7 x weekly - 1 sets - 3 reps - 30 sec hold ? ?

## 2021-08-25 NOTE — Therapy (Signed)
[facility] ?[address] [place] [address] ?[place], [place], [address] ?Phone: [phone]   Fax:  [phone] ? ?Physical Therapy Treatment ? ?Patient Details  ?Name: Joy Rogers ?MRN: [id number] ?Date of Birth: [birth year] ?Referring Provider (PT): Dr Everrett Coombe ? ? ?Encounter Date: 08/25/2021 ? ? PT End of Session - 08/25/21 1605   ? ? Visit Number 4   ? Number of Visits 12   ? Date for PT Re-Evaluation 09/23/21   ? PT Start Time 1604   ? PT Stop Time 1657   ? PT Time Calculation (min) 53 min   ? Activity Tolerance Patient tolerated treatment well   ? ?  ?  ? ?  ? ? ?Past Medical History:  ?Diagnosis Date  ? Anxiety   ? Asthma   ? Ovarian cyst   ? ? ?Past Surgical History:  ?Procedure Laterality Date  ? TONSILLECTOMY    ? WISDOM TOOTH EXTRACTION    ? ? ?There were no vitals filed for this visit. ? ? Subjective Assessment - 08/25/21 1605   ? ? Subjective Increased pain inthe Rt posterior lateral hip and thigh with some pain into the posterior thigh. She was doing well until yesterday morning. She awoke with pain yesterday and pain has continued through today. Pain is more intense. Patient does not know of anything that she did differently over the weekend that may have irritated things. She contacted Dr Ashley Royalty and he has called in some new medications   ? Currently in Pain? Yes   ? Pain Score 6    ? Pain Location Hip   ? Pain Orientation Right   ? Pain Descriptors / Indicators Sharp;Aching;Tightness   ? Pain Type Acute pain   ? ?  ?  ? ?  ? ? ? ? ? [facility] PT Assessment - 08/25/21 0001   ? ?  ? Assessment  ? Medical Diagnosis Lumbar radiculopathy   ? Referring Provider (PT) Dr Everrett Coombe   ? Onset Date/Surgical Date 05/27/21   ? Hand Dominance Right   ? Next MD Visit to schedule   ? Prior Therapy chiropractic upper back 2020   ?  ? Palpation  ? Palpation comment muscular tightness through Rt posterior hip in the piriformis, glut min/med, ITB into hip flexors   ? ?  ?   ? ?  ? ? ? ? ? ? ? ? ? ? ? ? ? ? ? ? [facility] Adult PT Treatment/Exercise - 08/25/21 0001   ? ?  ? Therapeutic Activites   ? Lifting midified deadlift from 10 inch surface 10#KB x 5 reps VC to engaged core and hinge hips, bend knees   ?  ? Lumbar Exercises: Stretches  ? Press Ups 10 reps   ? Press Ups Limitations 2-3 sec   ?  ? Lumbar Exercises: Standing  ? Wall Slides 10 reps;5 seconds   ? Wall Slides Limitations quarter squat   ? Row Strengthening;Both;10 reps;Theraband   ? Theraband Level (Row) Level 4 (Blue)   ? Other Standing Lumbar Exercises antirotation blue TB x 5 reps 5 sec hold   ?  ? Cryotherapy  ? Number Minutes Cryotherapy 10 Minutes   ? Cryotherapy Location Lumbar Spine;Hip   ? Type of Cryotherapy Ice pack   ?  ? Manual Therapy  ? Manual therapy comments skilled palpation to assess response to DN and manual work   ? Joint Mobilization PA mobs through the Rt greater trochanter   ?  Soft tissue mobilization deep tissue work through the posterior Rt hip in piriformis and glutes   ? Myofascial Release posterior Rt hip   ? ?  ?  ? ?  ? ? ? Trigger Point Dry Needling - 08/25/21 0001   ? ? Consent Given? Yes   ? Education Handout Provided Previously provided   ? E-stim with Dry Needling Details no estim with DN   ? Gluteus Minimus Response Palpable increased muscle length;Twitch response elicited   ? Gluteus Medius Response Palpable increased muscle length;Twitch response elicited   ? Gluteus Maximus Response Palpable increased muscle length;Twitch response elicited   ? Piriformis Response Palpable increased muscle length;Twitch response elicited   ? ?  ?  ? ?  ? ? ? ? ? ? ? ? PT Education - 08/25/21 1643   ? ? Education Details HEP   ? Person(s) Educated Patient   ? Methods Explanation;Demonstration;Tactile cues;Verbal cues;Handout   ? Comprehension Verbalized understanding;Returned demonstration;Verbal cues required;Tactile cues required   ? ?  ?  ? ?  ? ? ? ? ? ? PT Long Term Goals - 08/12/21 1753   ? ?  ? PT  LONG TERM GOAL #1  ? Title Improve core strength and stability allowing patient to perform 30-45 min of exercise with no pain   ? Time 6   ? Period Weeks   ? Status New   ? Target Date 09/23/21   ?  ? PT LONG TERM GOAL #2  ? Title Decrease pain in LB and Rt LE pain by 75-100% allowing patient to return to all functional activities   ? Time 6   ? Period Weeks   ? Status New   ? Target Date 09/23/21   ?  ? PT LONG TERM GOAL #3  ? Title Patient to demonstrate and/or verbalize proper transfers; postures/positions; lifting for back care   ? Time 6   ? Period Weeks   ? Status New   ? Target Date 09/23/21   ?  ? PT LONG TERM GOAL #4  ? Title Independent in HEP includig aquatic program as indicated   ? Time 6   ? Period Weeks   ? Status New   ? Target Date 09/23/21   ?  ? PT LONG TERM GOAL #5  ? Title Improve functional limitation score to 58   ? Time 6   ? Period Weeks   ? Status New   ? Target Date 09/23/21   ? ?  ?  ? ?  ? ? ? ? ? ? ? ? Plan - 08/25/21 1630   ? ? Clinical Impression Statement Flare up of Rt posterior hip and thigh pain in the past two days. Patient has persistnet muscular tightness through the glut min/med/mas and piriformis. Good response to DN and manual work. Added core stabilization exercises to pt tolerance in standing. Will begin walking program focus on core. Continue with back care principles.   ? Rehab Potential Good   ? PT Frequency 2x / week   ? PT Duration 6 weeks   ? PT Treatment/Interventions ADLs/Self Care Home Management;Aquatic Therapy;Cryotherapy;Electrical Stimulation;Iontophoresis 4mg /ml Dexamethasone;Moist Heat;Ultrasound;Gait training;Stair training;Functional mobility training;Therapeutic activities;Therapeutic exercise;Balance training;Neuromuscular re-education;Patient/family education;Dry needling;Taping   ? PT Next Visit Plan progress HEP; progress with stretching for hip flexors; extension program; core stabilization; DN vs manual work; continue DN and manual work; modalities  as indicated   ? PT Home Exercise Plan   ? Consulted and Agree with  Plan of Care Patient   ? ?  ?  ? ?  ? ? ?Patient will benefit from skilled therapeutic intervention in order to improve the following deficits and impairments:    ? ?Visit Diagnosis: ?Radiculopathy, lumbar region ? ?Muscle weakness (generalized) ? ?Other symptoms and signs involving the musculoskeletal system ? ? ? ? ?Problem List ?Patient Active Problem List  ? Diagnosis Date Noted  ? Migraine with aura 06/24/2021  ? Generalized anxiety disorder 06/24/2021  ? Other seasonal allergic rhinitis 06/24/2021  ? Anxiety 06/24/2021  ? Amenorrhea 06/24/2021  ? Other fatigue 06/24/2021  ? Low back pain with sciatica 06/24/2021  ? ? ?Val Riles, PT, MPH  ?08/25/2021, 4:50 PM ? ?[place] [facility] ?[address] [place] [address] ?[place], [place], [address] ?Phone: [phone]   Fax:  [phone] ? ?Name: Azadeh Hyder ?MRN: [id number] ?Date of Birth: 1990-11-27 ? ? ? ?

## 2021-08-30 ENCOUNTER — Ambulatory Visit: Payer: BC Managed Care – PPO | Admitting: Sports Medicine

## 2021-08-30 ENCOUNTER — Other Ambulatory Visit: Payer: Self-pay

## 2021-08-30 DIAGNOSIS — M5416 Radiculopathy, lumbar region: Secondary | ICD-10-CM | POA: Diagnosis not present

## 2021-08-30 MED ORDER — MELOXICAM 15 MG PO TABS: ORAL_TABLET | ORAL | 3 refills | Status: DC

## 2021-08-30 NOTE — Progress Notes (Signed)
? ? ?  Procedures performed today:   ? ?None. ? ?Independent interpretation of notes and tests performed by another provider:  ? ?None. ? ?Brief History, Exam, Impression, and Recommendations:   ? 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 ?She has had steroids, she also had some physical therapy, unfortunately continues to have significant discomfort, numbness and tingling, pain, burning right posterior lateral thigh, posterior lateral lower leg to the bottom of the foot, worse with sitting, flexion, Valsalva, no red flag symptoms. ?X-rays were reviewed and do show L4-5 and L5-S1 DDD. ?I do not think this is sciatica, I think this is more radicular discomfort, I would like physical therapy to focus more on her lumbar spine with core conditioning and stabilization, we discussed exercises to do and avoid in the gym, switching to meloxicam. ?After 3 more weeks of therapy which will give her 6 total weeks we will consider MRI for interventional planning. ?We did discuss the anatomy and evolutionary anthropology of disc disease in the office today. ? ?Chronic process with exacerbation and pharmacologic intervention ? ?___________________________________________ ?Joy Rogers. Benjamin Stain, M.D., ABFM., CAQSM. ?Primary Care and Sports Medicine ?[facility] ? ?Adjunct Instructor of Family Medicine  ?[employer] ?

## 2021-08-30 NOTE — Assessment & Plan Note (Signed)
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 ?She has had steroids, she also had some physical therapy, unfortunately continues to have significant discomfort, numbness and tingling, pain, burning right posterior lateral thigh, posterior lateral lower leg to the bottom of the foot, worse with sitting, flexion, Valsalva, no red flag symptoms. ?X-rays were reviewed and do show L4-5 and L5-S1 DDD. ?I do not think this is sciatica, I think this is more radicular discomfort, I would like physical therapy to focus more on her lumbar spine with core conditioning and stabilization, we discussed exercises to do and avoid in the gym, switching to meloxicam. ?After 3 more weeks of therapy which will give her 6 total weeks we will consider MRI for interventional planning. ?We did discuss the anatomy and evolutionary anthropology of disc disease in the office today. ?

## 2021-09-02 ENCOUNTER — Other Ambulatory Visit: Payer: Self-pay

## 2021-09-02 ENCOUNTER — Ambulatory Visit: Payer: BC Managed Care – PPO | Admitting: Rehabilitative and Restorative Service Providers"

## 2021-09-02 DIAGNOSIS — M5416 Radiculopathy, lumbar region: Secondary | ICD-10-CM

## 2021-09-02 NOTE — Therapy (Signed)
[place] [facility] ?[address] [place] [address] ?[place], [place], [address] ?Phone: [phone]   Fax:  [phone] ? ?Physical Therapy Evaluation ? ?Patient Details  ?Name: Joy Rogers ?MRN: [id number] ?Date of Birth: 1991-03-21 ?Referring Provider (PT): Dr Selena Batten Matthews/Dt Benjamin Stain ? ? ?Encounter Date: 09/02/2021 ? ? PT End of Session - 09/02/21 1801   ? ? Visit Number 5   ? Number of Visits 16   ? Date for PT Re-Evaluation 10/14/21   ? PT Start Time 1600   ? PT Stop Time 1657   ? PT Time Calculation (min) 57 min   ? Activity Tolerance Patient tolerated treatment well   ? ?  ?  ? ?  ? ? ?Past Medical History:  ?Diagnosis Date  ? Anxiety   ? Asthma   ? Ovarian cyst   ? ? ?Past Surgical History:  ?Procedure Laterality Date  ? TONSILLECTOMY    ? WISDOM TOOTH EXTRACTION    ? ? ?There were no vitals filed for this visit. ? ? ? Subjective Assessment - 09/02/21 1741   ? ? Subjective Patient reports that she saw Dr T last week and he changed her medication which has helped some. She had a good day yesterday but worse today. Some increased pain - aching in the lateral Rt hip and thigh with trial of traction and some of the core stabilization exercises.   ? Pain Score 5    ? Pain Location Hip   ? Pain Orientation Right   ? Pain Descriptors / Indicators Aching;Tightness   ? Pain Type Acute pain   ? Pain Radiating Towards posterior lateral hip to knee at times (occasionally to arch of foot)   ? Pain Onset More than a month ago   ? Pain Frequency Intermittent   ? ?  ?  ? ?  ? ? ? ? ? [facility] PT Assessment - 09/02/21 0001   ? ?  ? Assessment  ? Medical Diagnosis Lumbar radiculopathy   ? Referring Provider (PT) Dr Selena Batten Matthews/Dt Thekkekandam   ? Onset Date/Surgical Date 05/27/21   ? Hand Dominance Right   ? Next MD Visit to schedule   ? Prior Therapy chiropractic upper back 2020   ?  ? AROM  ? Lumbar Flexion 100%   ? Lumbar Extension 70% discomfort LB central   ? Lumbar - Right Side Bend 70%  increased pain in Rt LE   ? Lumbar - Left Side Bend 75% no change in pain   ? Lumbar - Right Rotation 50%   ? Lumbar - Left Rotation 50%   ?  ? Strength  ? Overall Strength Comments WFL's   ?  ? Flexibility  ? Hamstrings tight Rt   ? Quadriceps tight Rt > Lt   ? ITB tight Rt   ? Piriformis tight Rt   ?  ? Palpation  ? Palpation comment muscular tightness through Rt posterior hip in the piriformis, glut min/med, ITB into hip flexors   ? ?  ?  ? ?  ? ? ? ? ? ? ? ? ? ? ? ? ? ?Objective measurements completed on examination: See above findings.  ? ? ? ? ? [facility] Adult PT Treatment/Exercise - 09/02/21 0001   ? ?  ? Therapeutic Activites   ? Lifting modified deadlift from 10 inch surface 10#KB x 5 reps VC to engaged core and hinge hips, bend knees patient reported some pain in the posterior hip and thigh trial  for 14 inch which also produced posterior hip and thigh pain - stopped - pt will not add this for home   ?  ? Lumbar Exercises: Stretches  ? Standing Extension 3 reps   ? Standing Extension Limitations 2-3 sec   ? Prone on Elbows Stretch 2 reps;60 seconds   ? Prone on Elbows Stretch Limitations centeralizatoin of symptoms   ? Press Ups 5 reps   ? Press Ups Limitations 2-3 sec   ?  ? Lumbar Exercises: Standing  ? Wall Slides 10 reps;5 seconds   ? Row Strengthening;Both;10 reps;Theraband   ? Theraband Level (Row) Level 4 (Blue)   ? Row Limitations bow and arrow x 10 each side blue TB   ? Other Standing Lumbar Exercises antirotation blue TB x 5 reps 5 sec hold   ?  ? Lumbar Exercises: Seated  ? Sit to Stand 10 reps   ? Sit to Stand Limitations VC to engage core holding 5#KB   ?  ? Lumbar Exercises: Supine  ? Dead Bug 10 reps   ? Dead Bug Limitations core engaged   ?  ? Traction  ? Type of Traction Lumbar   ? Min (lbs) 35   ? Max (lbs) 70   ? Hold Time static   ? Time 10 min   ? ?  ?  ? ?  ? ? ? ? ? ? ? ? ? ? ? ? ? ? ? PT Long Term Goals - 09/02/21 1800   ? ?  ? PT LONG TERM GOAL #1  ? Title Improve core strength and  stability allowing patient to perform 30-45 min of exercise with no pain   ? Time 6   ? Period Weeks   ? Status New   ? Target Date 10/14/21   ?  ? PT LONG TERM GOAL #2  ? Title Decrease pain in LB and Rt LE pain by 75-100% allowing patient to return to all functional activities   ? Time 6   ? Period Weeks   ? Status New   ? Target Date 10/14/21   ?  ? PT LONG TERM GOAL #3  ? Title Patient to demonstrate and/or verbalize proper transfers; postures/positions; lifting for back care   ? Time 6   ? Period Weeks   ? Status New   ? Target Date 10/14/21   ?  ? PT LONG TERM GOAL #4  ? Title Independent in HEP including aquatic program as indicated   ? Time 6   ? Period Weeks   ? Status New   ? Target Date 10/14/21   ?  ? PT LONG TERM GOAL #5  ? Title Improve functional limitation score to 58   ? Time 6   ? Period Weeks   ? Status New   ? Target Date 10/14/21   ? ?  ?  ? ?  ? ? ? ? ? ? ? ? ? Plan - 09/02/21 1657   ? ? Clinical Impression Statement Patient reports some overall improvement in symptoms since beginning therapy. Patient is unable to tolerate some core stabilization exercises due to increased pain. Did add weights to wall squat and added supine stabilization and step ups. Discussed gym machines she could try. Trial of lumbar traction tolerated with report of feeling "maybe a little better" following traction. WIll continue with core stabilization; stretching; strengthening; trial of lumbar traction. Traction benefits may be minimal due to patient being here for treatment only  one day/week. Goals continue to be approprate as established at initial evaluation.   ? Rehab Potential Good   ? PT Frequency 2x / week   ? PT Duration 6 weeks   ? PT Treatment/Interventions ADLs/Self Care Home Management;Aquatic Therapy;Cryotherapy;Electrical Stimulation;Iontophoresis 4mg /ml Dexamethasone;Moist Heat;Ultrasound;Gait training;Stair training;Functional mobility training;Therapeutic activities;Therapeutic exercise;Balance  training;Neuromuscular re-education;Patient/family education;Dry needling;Taping   ? PT Next Visit Plan progress HEP; progress with stretching for hip flexors; extension program; core stabilization; DN vs manual work; continue DN and manual work; modalities as indicated   ? PT Home Exercise Plan [email or web address]   ? Consulted and Agree with Plan of Care Patient   ? ?  ?  ? ?  ? ? ?Patient will benefit from skilled therapeutic intervention in order to improve the following deficits and impairments:    ? ?Visit Diagnosis: ?Right lumbar radiculitis ? ? ? ? ?Problem List ?Patient Active Problem List  ? Diagnosis Date Noted  ? Migraine with aura 06/24/2021  ? Generalized anxiety disorder 06/24/2021  ? Other seasonal allergic rhinitis 06/24/2021  ? Anxiety 06/24/2021  ? Amenorrhea 06/24/2021  ? Other fatigue 06/24/2021  ? Right lumbar radiculitis 06/24/2021  ? ? ?Val Rileselyn P Susanne Baumgarner, PT, MPH ?09/02/2021, 6:03 PM ? ?[place] [facility] ?[address] [place] ?[place], [place], [address] ?Phone: [phone]   Fax:  [phone] ? ?Name: Joy Rogers ?MRN: [id number] ?Date of Birth: 04/16/1991 ? ? ?

## 2021-09-06 ENCOUNTER — Encounter: Payer: Self-pay | Admitting: Sports Medicine

## 2021-09-06 MED ORDER — TRAMADOL HCL 50 MG PO TABS
50.0000 mg | ORAL_TABLET | Freq: Three times a day (TID) | ORAL | 0 refills | Status: DC | PRN
Start: 2021-09-06 — End: 2021-09-08

## 2021-09-08 ENCOUNTER — Other Ambulatory Visit: Payer: Self-pay

## 2021-09-08 ENCOUNTER — Ambulatory Visit: Payer: BC Managed Care – PPO | Admitting: Sports Medicine

## 2021-09-08 DIAGNOSIS — M5416 Radiculopathy, lumbar region: Secondary | ICD-10-CM | POA: Diagnosis not present

## 2021-09-08 MED ORDER — TRAMADOL HCL 50 MG PO TABS: 50.0000 mg | ORAL_TABLET | Freq: Three times a day (TID) | ORAL | 0 refills | Status: DC

## 2021-09-08 NOTE — Progress Notes (Signed)
? ? ?  Procedures performed today:   ? ?None. ? ?Independent interpretation of notes and tests performed by another provider:  ? ?None. ? ?Brief History, Exam, Impression, and Recommendations:   ? 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 ?X-rays historically have shown L4-S1 DDD. ?We started her off conservatively, at this point she has only done about 4 weeks of physician directed conservative treatment, she is 1 week out from her last visit. ?I did explain to her that she needed 6 total weeks of conservative treatment before we could order the MRI and before we get it covered based on her symptoms, so we will bump her tramadol up to 2 tabs 3 times a day for the next 2 weeks at which point if she is not significantly better we will proceed with MRI and epidural. ? ? ? ?___________________________________________ ?Gwen Her. Dianah Field, M.D., ABFM., CAQSM. ?Primary Care and Sports Medicine ?[place] ? ?Adjunct Instructor of Family Medicine  ?[employer] ?

## 2021-09-08 NOTE — Assessment & Plan Note (Signed)
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 ?X-rays historically have shown L4-S1 DDD. ?We started her off conservatively, at this point she has only done about 4 weeks of physician directed conservative treatment, she is 1 week out from her last visit. ?I did explain to her that she needed 6 total weeks of conservative treatment before we could order the MRI and before we get it covered based on her symptoms, so we will bump her tramadol up to 2 tabs 3 times a day for the next 2 weeks at which point if she is not significantly better we will proceed with MRI and epidural. ?

## 2021-09-09 ENCOUNTER — Encounter: Payer: Self-pay | Admitting: Rehabilitative and Restorative Service Providers"

## 2021-09-09 ENCOUNTER — Ambulatory Visit: Payer: BC Managed Care – PPO | Admitting: Rehabilitative and Restorative Service Providers"

## 2021-09-09 DIAGNOSIS — M6281 Muscle weakness (generalized): Secondary | ICD-10-CM

## 2021-09-09 DIAGNOSIS — R29898 Other symptoms and signs involving the musculoskeletal system: Secondary | ICD-10-CM

## 2021-09-09 DIAGNOSIS — M5416 Radiculopathy, lumbar region: Secondary | ICD-10-CM

## 2021-09-09 NOTE — Patient Instructions (Signed)
Access Code: [email or web address] ?URL: [email or web address] ?Date: 09/09/2021 ?Prepared by: Gillermo Murdoch ? ?Exercises ?Prone Press Up - 2 x daily - 7 x weekly - 1 sets - 10 reps - 2-3 sec hold ?Prone Press Up On Elbows - 2 x daily - 7 x weekly - 1 sets - 3 reps - 30 sec hold ?Standing Back Extension - 2 x daily - 7 x weekly - 1 sets - 3 reps - 3-5 sec hold ?Supine Piriformis Stretch with Leg Straight - 2 x daily - 7 x weekly - 1 sets - 3 reps - 30 sec hold ?Hooklying Hamstring Stretch with Strap - 2 x daily - 7 x weekly - 1 sets - 3 reps - 30 sec hold ?Supine ITB Stretch with Strap - 2 x daily - 7 x weekly - 1 sets - 3 reps - 30 sec hold ?Supine Transversus Abdominis Bracing with Pelvic Floor Contraction - 2 x daily - 7 x weekly - 1 sets - 10 reps - 10sec hold ?Sit to Stand - 2 x daily - 7 x weekly - 1 sets - 10 reps - 3-5 sec hold ?Seated Hamstring Stretch - 2 x daily - 7 x weekly - 1 sets - 3 reps - 30 sec hold ?Wall Quarter Squat - 2 x daily - 7 x weekly - 1-2 sets - 10 reps - 5-10 sec hold ?Standing Bilateral Low Shoulder Row with Anchored Resistance - 2 x daily - 7 x weekly - 1-3 sets - 10 reps - 2-3 sec hold ?Anti-Rotation Lateral Stepping with Press - 2 x daily - 7 x weekly - 1-2 sets - 10 reps - 2-3 sec hold ?Half Deadlift with Kettlebell - 2 x daily - 7 x weekly - 1 sets - 5-10 reps ?Hip Flexor Stretch at Edge of Bed - 2 x daily - 7 x weekly - 1 sets - 3 reps - 30 sec hold ?Drawing Bow - 1 x daily - 7 x weekly - 1 sets - 10 reps - 3 sec hold ?Hooklying Isometric Clamshell - 2 x daily - 7 x weekly - 1 sets - 10 reps - 3 sec hold ?Dead Bug - 2 x daily - 7 x weekly - 1-2 sets - 10 reps - 2-3 sec hold ?Plank on Counter - 2 x daily - 7 x weekly - 1 sets - 3 reps - 30-60 sec hold ?Side Stepping with Resistance at Thighs - 1 x daily - 7 x weekly ? ?

## 2021-09-09 NOTE — Therapy (Signed)
[place] [facility] ?[address] [place] [address] ?[place], [place], [address] ?Phone: [phone]   Fax:  [phone] ? ?Physical Therapy Treatment ? ?Patient Details  ?Name: Joy Rogers ?MRN: [id number] ?Date of Birth: 1991-06-20 ?Referring Provider (PT): Dr Selena Batten Matthews/Dt Benjamin Stain ? ? ?Encounter Date: 09/09/2021 ? ? PT End of Session - 09/09/21 1614   ? ? Visit Number 6   ? Number of Visits 16   ? Date for PT Re-Evaluation 10/14/21   ? PT Start Time 1614   ? PT Stop Time 1703   ? PT Time Calculation (min) 49 min   ? ?  ?  ? ?  ? ? ?Past Medical History:  ?Diagnosis Date  ? Anxiety   ? Asthma   ? Ovarian cyst   ? ? ?Past Surgical History:  ?Procedure Laterality Date  ? TONSILLECTOMY    ? WISDOM TOOTH EXTRACTION    ? ? ?There were no vitals filed for this visit. ? ? Subjective Assessment - 09/09/21 1616   ? ? Subjective Some better. MD changed medication and that seems to be helping. She tried to sleep with the lumbar support but she could not get comfortable. She thought the traction helped some. She had fewer sharp pains for the next 24 hours. Exercises are going "pretty good" She has been able to tolerate the exercises better with the new medication. Sharp pains in Rt LE are decreased in frequency. Pain is generally less intense.   ? Currently in Pain? Yes   ? Pain Score 3    ? Pain Location Hip   ? Pain Orientation Right   ? Pain Descriptors / Indicators Aching;Tightness   ? Pain Type Acute pain   ? Pain Radiating Towards sharp pain to arch of foot mostly in the morning; discomfort and aching in the posterior lateral hip - sharp pain less frequent   ? Pain Onset More than a month ago   ? Pain Frequency Intermittent   ? ?  ?  ? ?  ? ? ? ? ? ? ? ? ? ? ? ? ? ? ? ? ? ? ? ? [facility] Adult PT Treatment/Exercise - 09/09/21 0001   ? ?  ? Lumbar Exercises: Stretches  ? Standing Extension 3 reps   ? Standing Extension Limitations 2-3 sec   ? Prone on Elbows Stretch 2 reps;60 seconds   ?  Press Ups 5 reps   ? Press Ups Limitations 2-3 sec propping on elbows unable to press up with extended arms   ?  ? Lumbar Exercises: Aerobic  ? Tread Mill 1.0 to 1.5 mph x 7.5 min   ? Other Aerobic Exercise walking 5# KB each hand x 2 laps in gym   ?  ? Lumbar Exercises: Standing  ? Wall Slides 10 reps;5 seconds   ? Wall Slides Limitations quarter squat with push pull 10# wt chest level   ? Row Strengthening;Both;10 reps;Theraband   ? Theraband Level (Row) Level 4 (Blue)   ? Row Limitations bow and arrow   ? Other Standing Lumbar Exercises antirotation blue TB x 5 reps 5 sec hold   ? Other Standing Lumbar Exercises step up 6 nch step frd x 10 reps each side; side step with heel ta 4 inch step x 10 each side - backward walking x 10 ft x 2 reps some discomfort   ?  ? Lumbar Exercises: Supine  ? Dead Bug 10 reps   4# wts each UE  ?  Dead Bug Limitations core engaged   ? Bridge 5 reps;3 seconds   ?  ? Lumbar Exercises: Prone  ? Straight Leg Raise 5 reps   ? Straight Leg Raises Limitations VC to engage core   ? Other Prone Lumbar Exercises counter plank x 60 sec x 3 reps   ? ?  ?  ? ?  ? ? ? ? ? ? ? ? ? ? PT Education - 09/09/21 1657   ? ? Education Details HEP   ? Person(s) Educated Patient   ? Methods Explanation;Demonstration;Tactile cues;Verbal cues;Handout   ? Comprehension Verbalized understanding;Returned demonstration;Verbal cues required;Tactile cues required   ? ?  ?  ? ?  ? ? ? ? ? ? PT Long Term Goals - 09/02/21 1800   ? ?  ? PT LONG TERM GOAL #1  ? Title Improve core strength and stability allowing patient to perform 30-45 min of exercise with no pain   ? Time 6   ? Period Weeks   ? Status New   ? Target Date 10/14/21   ?  ? PT LONG TERM GOAL #2  ? Title Decrease pain in LB and Rt LE pain by 75-100% allowing patient to return to all functional activities   ? Time 6   ? Period Weeks   ? Status New   ? Target Date 10/14/21   ?  ? PT LONG TERM GOAL #3  ? Title Patient to demonstrate and/or verbalize proper  transfers; postures/positions; lifting for back care   ? Time 6   ? Period Weeks   ? Status New   ? Target Date 10/14/21   ?  ? PT LONG TERM GOAL #4  ? Title Independent in HEP including aquatic program as indicated   ? Time 6   ? Period Weeks   ? Status New   ? Target Date 10/14/21   ?  ? PT LONG TERM GOAL #5  ? Title Improve functional limitation score to 58   ? Time 6   ? Period Weeks   ? Status New   ? Target Date 10/14/21   ? ?  ?  ? ?  ? ? ? ? ? ? ? ? Plan - 09/09/21 1622   ? ? Clinical Impression Statement Patient reports that the new medication is helping decrease pain. She did not notice a significant change with lumbar traction. Continued with core stabilization adding reps and exercises.   ? Rehab Potential Good   ? PT Frequency 2x / week   ? PT Duration 6 weeks   ? PT Treatment/Interventions ADLs/Self Care Home Management;Aquatic Therapy;Cryotherapy;Electrical Stimulation;Iontophoresis 4mg /ml Dexamethasone;Moist Heat;Ultrasound;Gait training;Stair training;Functional mobility training;Therapeutic activities;Therapeutic exercise;Balance training;Neuromuscular re-education;Patient/family education;Dry needling;Taping   ? PT Next Visit Plan progress HEP; progress with extension program; focus on core stabilization; DN vs manual work; continue DN and manual work; modalities as indicated   ? PT Home Exercise Plan [email or web address]   ? Consulted and Agree with Plan of Care Patient   ? ?  ?  ? ?  ? ? ?Patient will benefit from skilled therapeutic intervention in order to improve the following deficits and impairments:    ? ?Visit Diagnosis: ?Right lumbar radiculitis ? ?Radiculopathy, lumbar region ? ?Muscle weakness (generalized) ? ?Other symptoms and signs involving the musculoskeletal system ? ? ? ? ?Problem List ?Patient Active Problem List  ? Diagnosis Date Noted  ? Migraine with aura 06/24/2021  ? Generalized anxiety disorder 06/24/2021  ? Other seasonal allergic  rhinitis 06/24/2021  ? Anxiety 06/24/2021  ?  Amenorrhea 06/24/2021  ? Other fatigue 06/24/2021  ? Right lumbar radiculitis 06/24/2021  ? ? ?Val Riles, PT, MPH  ?09/09/2021, 5:15 PM ? ?[place] [facility] ?[address] [place] [address] ?[place], [place], [address] ?Phone: [phone]   Fax:  [phone] ? ?Name: Venola Castello ?MRN: [id number] ?Date of Birth: 17-Mar-1991 ? ? ? ?

## 2021-09-10 ENCOUNTER — Encounter: Payer: Self-pay | Admitting: Sports Medicine

## 2021-09-13 ENCOUNTER — Other Ambulatory Visit: Payer: Self-pay | Admitting: Family Medicine

## 2021-09-16 ENCOUNTER — Other Ambulatory Visit: Payer: Self-pay

## 2021-09-16 ENCOUNTER — Encounter: Payer: Self-pay | Admitting: Rehabilitative and Restorative Service Providers"

## 2021-09-16 ENCOUNTER — Ambulatory Visit: Payer: BC Managed Care – PPO | Admitting: Rehabilitative and Restorative Service Providers"

## 2021-09-16 DIAGNOSIS — R29898 Other symptoms and signs involving the musculoskeletal system: Secondary | ICD-10-CM

## 2021-09-16 DIAGNOSIS — M5416 Radiculopathy, lumbar region: Secondary | ICD-10-CM

## 2021-09-16 DIAGNOSIS — M6281 Muscle weakness (generalized): Secondary | ICD-10-CM

## 2021-09-16 NOTE — Patient Instructions (Signed)
?  Access Code: [email or web address] ?URL: [email or web address] ?Date: 09/16/2021 ?Prepared by: Corlis Leak ? ?Exercises ?- Prone Press Up  - 2 x daily - 7 x weekly - 1 sets - 10 reps - 2-3 sec  hold ?- Prone Press Up On Elbows  - 2 x daily - 7 x weekly - 1 sets - 3 reps - 30 sec  hold ?- Standing Back Extension  - 2 x daily - 7 x weekly - 1 sets - 3 reps - 3-5 sec  hold ?- Supine Transversus Abdominis Bracing with Pelvic Floor Contraction  - 2 x daily - 7 x weekly - 1 sets - 10 reps - 10sec  hold ?- Sit to Stand  - 2 x daily - 7 x weekly - 1 sets - 10 reps - 3-5 sec  hold ?- Wall Quarter Squat  - 2 x daily - 7 x weekly - 1-2 sets - 10 reps - 5-10 sec  hold ?- Standing Bilateral Low Shoulder Row with Anchored Resistance  - 2 x daily - 7 x weekly - 1-3 sets - 10 reps - 2-3 sec  hold ?- Anti-Rotation Lateral Stepping with Press  - 2 x daily - 7 x weekly - 1-2 sets - 10 reps - 2-3 sec  hold ?- Half Deadlift with Kettlebell  - 2 x daily - 7 x weekly - 1 sets - 5-10 reps ?- Drawing Bow  - 1 x daily - 7 x weekly - 1 sets - 10 reps - 3 sec  hold ?- Hooklying Isometric Clamshell  - 2 x daily - 7 x weekly - 1 sets - 10 reps - 3 sec  hold ?- Dead Bug  - 2 x daily - 7 x weekly - 1-2 sets - 10 reps - 2-3 sec  hold ?- Plank on Counter  - 2 x daily - 7 x weekly - 1 sets - 3 reps - 30-60 sec  hold ?- Side Stepping with Resistance at Thighs  - 1 x daily - 7 x weekly ? ?

## 2021-09-16 NOTE — Therapy (Addendum)
[facility] ?[place] ?[place], [place], [address] ?Phone: [phone]   Fax:  [phone] ? ?Physical Therapy Treatment and Discharge Summary  ? ?PHYSICAL THERAPY DISCHARGE SUMMARY ? ?Visits from Start of Care: 7 ? ?Current functional level related to goals / functional outcomes: ?See last progress note for discharge status  ?  ?Remaining deficits: ?Unknown  ?  ?Education / Equipment: ?HEP   ? ?Patient agrees to discharge. Patient goals were not met. Patient is being discharged due to not returning since the last visit. ? ?Tannah Dreyfuss P. Helene Kelp PT, MPH ?10/27/21 3:47 PM ? ? ?Patient Details  ?Name: Joy Rogers ?MRN: [id number] ?Date of Birth: 1990-07-24 ?Referring Provider (PT): Dr Einar Pheasant Matthews/Dt Dianah Field ? ? ?Encounter Date: 09/16/2021 ? ? PT End of Session - 09/16/21 1657   ? ? Visit Number 7   ? Number of Visits 16   ? Date for PT Re-Evaluation 10/14/21   ? PT Start Time 1657   ? PT Stop Time 1742   cold pack end of treatment  ? PT Time Calculation (min) 45 min   ? Activity Tolerance Patient tolerated treatment well   ? ?  ?  ? ?  ? ? ?Past Medical History:  ?Diagnosis Date  ? Anxiety   ? Asthma   ? Ovarian cyst   ? ? ?Past Surgical History:  ?Procedure Laterality Date  ? TONSILLECTOMY    ? WISDOM TOOTH EXTRACTION    ? ? ?There were no vitals filed for this visit. ? ? Subjective Assessment - 09/16/21 1659   ? ? Subjective Patient reports that she usually has pain in the morning from awakening at 6:30 lasting to about 8-9 am. Today she has had pain in the LB which she noticed yesterday for the first time since December. She continues to have pain in the Rt posterior hip and thigh but not often below knee. Occasional sharp pain in the calf. She awoke on her Rt side this morning and usually only sleeps on the Lt side - not sure if that may have increased symtpoms. Has been working on the core strengthening exercises. She has been taking the pain meds every  6-8 hours for the past week. She is taking 50 mg of tramadol. Appointment with Dr T Monday.   ? Currently in Pain? Yes   ? Pain Score 6    ? Pain Location Hip   ? Pain Orientation Right   ? Pain Descriptors / Indicators Aching;Tightness   ? Pain Type Acute pain   ? ?  ?  ? ?  ? ? ? ? ? [facility] PT Assessment - 09/16/21 0001   ? ?  ? Assessment  ? Medical Diagnosis Lumbar radiculopathy   ? Referring Provider (PT) Dr Einar Pheasant Matthews/Dt Thekkekandam   ? Onset Date/Surgical Date 05/27/21   ? Hand Dominance Right   ? Next MD Visit to schedule   ? Prior Therapy chiropractic upper back 2020   ?  ? AROM  ? Lumbar Flexion 50%   ? Lumbar Extension 50% central pain   ? Lumbar - Right Side Bend 80% pain Rt posterior hip   ? Lumbar - Left Side Bend 70% no change in pain   ? Lumbar - Right Rotation 50%   ? Lumbar - Left Rotation 50%   ?  ? Flexibility  ? Hamstrings tight Rt   ? Quadriceps tight Rt > Lt   ? ITB tight Rt   ? Piriformis  tight Rt   ?  ? Palpation  ? Spinal mobility pain with PA mobs lower lumbar spine   ? Palpation comment muscular tightness through Rt posterior hip in the piriformis, glut min/med, ITB into hip flexors   ?  ? Special Tests  ? Other special tests Increased Rt LE pain with Rt SLR   ? ?  ?  ? ?  ? ? ? ? ? ? ? ? ? ? ? ? ? ? ? ? [facility] PT Treatment/Exercise - 09/16/21 0001   ? ?  ? Therapeutic Activites   ? Lifting pain with attempts to add modified deadlift to core stabilization program   ?  ? Lumbar Exercises: Stretches  ? Standing Extension 3 reps   ? Standing Extension Limitations 2-3 sec   ? Prone on Elbows Stretch 2 reps;60 seconds   ? Prone on Elbows Stretch Limitations prone position decreased central and radicular pain Rt LE   ? Press Ups 5 reps   ? Press Ups Limitations 2-3 sec propping on elbows unable to press up with extended arms   ?  ? Lumbar Exercises: Aerobic  ? Tread Mill 1.0 to 1.5 mph x 7.5 min   ?  ? Lumbar Exercises: Standing  ? Wall Slides Limitations unable to tolerate quarter squat  with push pull 10# wt chest level   ? Row Strengthening;Both;10 reps;Theraband   ? Theraband Level (Row) Level 4 (Blue)   ? Row Limitations bow and arrow   ? Other Standing Lumbar Exercises Paloff press/antirotation blue TB x 5 reps 5 sec hold   ?  ? Lumbar Exercises: Seated  ? Sit to Stand Limitations pai with attempt to do sit to stand   ?  ? Lumbar Exercises: Supine  ? Dead Bug 10 reps   4# wts each UE  ? Dead Bug Limitations core engaged   ? Bridge Limitations unable to bridge due to pain   ?  ? Lumbar Exercises: Prone  ? Straight Leg Raises Limitations unable to lift LE due to pain   ? Other Prone Lumbar Exercises counter plank x 60 sec x 1 reps - painful   ?  ? Cryotherapy  ? Number Minutes Cryotherapy 10 Minutes   ? Cryotherapy Location Lumbar Spine;Hip   ? Type of Cryotherapy Ice pack   ?  ? Manual Therapy  ? Joint Mobilization PA mobs through lower lumbar spine and the Rt greater trochanter   ? Kinesiotex Inhibit Muscle   ?  ? Kinesiotix  ? Inhibit Muscle  rock tape bilat lumbar spine with 1 strip 50% pull bilat lumbar area to encourage neutral to slight trunk extension   ? ?  ?  ? ?  ? ? ? ? ? ? ? ? ? ? PT Education - 09/16/21 1757   ? ? Education Details HEP   ? Person(s) Educated Patient   ? Methods Explanation;Handout   ? Comprehension Verbalized understanding   ? ?  ?  ? ?  ? ? ? ? ? ? PT Long Term Goals - 09/02/21 1800   ? ?  ? PT LONG TERM GOAL #1  ? Title Improve core strength and stability allowing patient to perform 30-45 min of exercise with no pain   ? Time 6   ? Period Weeks   ? Status New   ? Target Date 10/14/21   ?  ? PT LONG TERM GOAL #2  ? Title Decrease pain in LB and Rt  LE pain by 75-100% allowing patient to return to all functional activities   ? Time 6   ? Period Weeks   ? Status New   ? Target Date 10/14/21   ?  ? PT LONG TERM GOAL #3  ? Title Patient to demonstrate and/or verbalize proper transfers; postures/positions; lifting for back care   ? Time 6   ? Period Weeks   ? Status New    ? Target Date 10/14/21   ?  ? PT LONG TERM GOAL #4  ? Title Independent in HEP including aquatic program as indicated   ? Time 6   ? Period Weeks   ? Status New   ? Target Date 10/14/21   ?  ? PT LONG TERM GOAL #5  ? Title Improve functional limitation score to 58   ? Time 6   ? Period Weeks   ? Status New   ? Target Date 10/14/21   ? ?  ?  ? ?  ? ? ? ? ? ? ? ? Plan - 09/16/21 1752   ? ? Clinical Impression Statement Patient reports that she had significant increase in pain this morning with radicular symptoms and pain for the remainder of the day. She has noted some improvement some days but continues to have episodic flare ups and pain in the posterior Rt hip and thigh have not ever fully resolved. We tried lumbar traction with was not effective at changing symptoms and with patient only coming for PT one time a week not likely to create lasting change. Patient has several core stabilization exercises but has not been able to do the higher level core work consistently due to pain. We have tired DN, TENS, manual work, traction as well as stretching and core stabilization. Symptoms continue. Lumbar ROM has decreased and patient now exhibits a positive SLR on Rt. Strength continues to be WFL's bilat LE's. She returns to Dr Dianah Field Monday 09/20/21 for follow up. We will hold PT until advised.   ? Rehab Potential Good   ? PT Frequency 2x / week   ? PT Duration 6 weeks   ? PT Treatment/Interventions ADLs/Self Care Home Management;Aquatic Therapy;Cryotherapy;Electrical Stimulation;Iontophoresis 22m/ml Dexamethasone;Moist Heat;Ultrasound;Gait training;Stair training;Functional mobility training;Therapeutic activities;Therapeutic exercise;Balance training;Neuromuscular re-education;Patient/family education;Dry needling;Taping   ? PT Next Visit Plan progress HEP; progress with extension program; focus on core stabilization; DN vs manual work; continue DN and manual work; modalities as indicated   ? PT Home Exercise  Plan [id number]  ? Consulted and Agree with Plan of Care Patient   ? ?  ?  ? ?  ? ? ?Patient will benefit from skilled therapeutic intervention in order to improve the following deficits and impairments:

## 2021-09-20 ENCOUNTER — Ambulatory Visit (INDEPENDENT_AMBULATORY_CARE_PROVIDER_SITE_OTHER): Payer: BC Managed Care – PPO | Admitting: Sports Medicine

## 2021-09-20 ENCOUNTER — Other Ambulatory Visit: Payer: Self-pay

## 2021-09-20 DIAGNOSIS — M5416 Radiculopathy, lumbar region: Secondary | ICD-10-CM

## 2021-09-20 NOTE — Progress Notes (Addendum)
? ? ?  Procedures performed today:   ? ?None. ? ?Independent interpretation of notes and tests performed by another provider:  ? ?MRI personally reviewed, there is a large paracentral disc extrusion at the L5-S1 level contacting the intraspinal right S1 nerve root. ? ?Brief History, Exam, Impression, and Recommendations:   ? 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 ?She has been seen multiple times since December 2022, she has had multiple sessions of formal physical therapy, steroids, gabapentin, NSAIDs, and is not improving. ?X-rays showed L4-S1 DDD. ?For this reason we are going to proceed with MRI for epidural planning. ?Tramadol did create excessive altered mental status so she will switch back to meloxicam. ?We will likely order the epidural with Dr. Laurian Brim per her request after I see the MRI results. ? ?Update: Very large L5-S1 disc protrusion compressing the right S1 nerve root which explains symptoms, I am going to go ahead and order her selective S1 epidural with Dr. Laurian Brim per her request. ? ? ? ?___________________________________________ ?Ihor Austin. Benjamin Stain, M.D., ABFM., CAQSM. ?Primary Care and Sports Medicine ?[facility] ? ?Adjunct Instructor of Family Medicine  ?[employer] ?

## 2021-09-20 NOTE — Assessment & Plan Note (Addendum)
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 ?She has been seen multiple times since December 2022, she has had multiple sessions of formal physical therapy, steroids, gabapentin, NSAIDs, and is not improving. ?X-rays showed L4-S1 DDD. ?For this reason we are going to proceed with MRI for epidural planning. ?Tramadol did create excessive altered mental status so she will switch back to meloxicam. ?We will likely order the epidural with Dr. Laurian Brim per her request after I see the MRI results. ? ?Update: Very large L5-S1 disc protrusion compressing the right S1 nerve root which explains symptoms, I am going to go ahead and order her selective S1 epidural with Dr. Laurian Brim per her request. ?

## 2021-09-21 ENCOUNTER — Ambulatory Visit (INDEPENDENT_AMBULATORY_CARE_PROVIDER_SITE_OTHER): Payer: BC Managed Care – PPO

## 2021-09-21 ENCOUNTER — Other Ambulatory Visit: Payer: Self-pay | Admitting: Family Medicine

## 2021-09-21 DIAGNOSIS — M5416 Radiculopathy, lumbar region: Secondary | ICD-10-CM | POA: Diagnosis not present

## 2021-09-21 DIAGNOSIS — M545 Low back pain, unspecified: Secondary | ICD-10-CM | POA: Diagnosis not present

## 2021-09-22 ENCOUNTER — Encounter: Payer: Self-pay | Admitting: Sports Medicine

## 2021-09-22 NOTE — Addendum Note (Signed)
Addended by: Monica Becton on: 09/22/2021 09:26 AM ? ? Modules accepted: Orders, Level of Service ? ?

## 2021-09-29 ENCOUNTER — Ambulatory Visit: Payer: BC Managed Care – PPO | Admitting: Rehabilitative and Restorative Service Providers"

## 2021-10-04 ENCOUNTER — Encounter: Payer: Self-pay | Admitting: Sports Medicine

## 2021-10-04 DIAGNOSIS — M5416 Radiculopathy, lumbar region: Secondary | ICD-10-CM

## 2021-10-07 ENCOUNTER — Ambulatory Visit
Admission: RE | Admit: 2021-10-07 | Discharge: 2021-10-07 | Disposition: A | Discharge status: Home / Self Care | Payer: BC Managed Care – PPO | Source: Ambulatory Visit | Attending: Sports Medicine | Admitting: Sports Medicine

## 2021-10-07 ENCOUNTER — Encounter: Payer: Self-pay | Admitting: Rehabilitative and Restorative Service Providers"

## 2021-10-07 DIAGNOSIS — M5416 Radiculopathy, lumbar region: Secondary | ICD-10-CM

## 2021-10-07 MED ORDER — METHYLPREDNISOLONE ACETATE 40 MG/ML INJ SUSP (RADIOLOG
80.0000 mg | Freq: Once | INTRAMUSCULAR | Status: AC
  Administered 2021-10-07: 80 mg via EPIDURAL

## 2021-10-07 MED ORDER — IOPAMIDOL (ISOVUE-M 200) INJECTION 41%
1.0000 mL | Freq: Once | INTRAMUSCULAR | Status: AC
  Administered 2021-10-07: 1 mL via EPIDURAL

## 2021-10-07 NOTE — Discharge Instructions (Signed)

## 2021-10-11 ENCOUNTER — Encounter: Payer: Self-pay | Admitting: Family Medicine

## 2021-10-11 ENCOUNTER — Encounter: Payer: Self-pay | Admitting: Sports Medicine

## 2021-10-11 ENCOUNTER — Telehealth: Payer: Self-pay | Admitting: *Deleted

## 2021-10-11 ENCOUNTER — Ambulatory Visit: Payer: BC Managed Care – PPO | Admitting: Family Medicine

## 2021-10-11 VITALS — BP 117/82 | HR 99 | Ht 65.0 in | Wt 209.0 lb

## 2021-10-11 DIAGNOSIS — M5441 Lumbago with sciatica, right side: Secondary | ICD-10-CM

## 2021-10-11 MED ORDER — GABAPENTIN 100 MG PO CAPS: 100.0000 mg | ORAL_CAPSULE | Freq: Three times a day (TID) | ORAL | 0 refills | Status: DC

## 2021-10-11 MED ORDER — HYDROCODONE-ACETAMINOPHEN 10-325 MG PO TABS
0.5000 | ORAL_TABLET | Freq: Four times a day (QID) | ORAL | 0 refills | Status: DC | PRN
Start: 2021-10-11 — End: 2021-10-19

## 2021-10-11 MED ORDER — HYDROCODONE-ACETAMINOPHEN 5-325 MG PO TABS
1.0000 | ORAL_TABLET | Freq: Four times a day (QID) | ORAL | 0 refills | Status: DC | PRN
Start: 2021-10-11 — End: 2021-10-11

## 2021-10-11 NOTE — Telephone Encounter (Signed)
OK, new rx sent ? ?Meds ordered this encounter  ?Medications  ? HYDROcodone-acetaminophen (NORCO) 10-325 MG tablet  ?  Sig: Take 0.5 tablets by mouth every 6 (six) hours as needed for up to 5 days.  ?  Dispense:  15 tablet  ?  Refill:  0  ? ? ?

## 2021-10-11 NOTE — Telephone Encounter (Signed)
Pt called and stated that when she went to p/u the pain medication she was informed that it was on national backorder.  ? ?The pharmacist advised her to see if the prescribing  doctor would send in for the 10-325 and she can cut these in half. They have plenty of these in stock.  ? ? ?Will fwd to Dr. Madilyn Fireman for advice.  ?

## 2021-10-11 NOTE — Progress Notes (Addendum)
? ?Acute Office Visit ? ?Subjective:  ? ? Patient ID: Joy Rogers, female    DOB: 1990-07-13, 31 y.o.   MRN: [id number] ? ?Chief Complaint  ?Patient presents with  ? Back Pain  ? ? ?HPI ?Patient is in today for  ? ?Pt reports that she recently received a nerve block on Thursday after getting her MRI. She stated that she has been in worse pain since receiving the injection. She hasn't been able to sleep, it hurts her to stand, walk or lie down.  Ice does seem to help she has been mostly using ibuprofen she quit taking the tramadol because it really was not helping.  She describes the pain as an intermittent shooting pain in that right low back area sometimes radiating down into the buttock and right lateral outer thigh. ?  ?She has tried using the Tramadol for pain and this didn't work at all. She also tried heat/ice, her TENS unit this provided some relief but not much.  ?  ?She tried to get in with Dr. Darene Lamer however she wasn't able she has an appointment scheduled with him on Friday for f/u. ? ?Past Medical History:  ?Diagnosis Date  ? Anxiety   ? Asthma   ? Ovarian cyst   ? ? ?Past Surgical History:  ?Procedure Laterality Date  ? TONSILLECTOMY    ? WISDOM TOOTH EXTRACTION    ? ? ?Family History  ?Problem Relation Age of Onset  ? Hypertension Mother   ? Lung cancer Father   ? Breast cancer Maternal Aunt   ? Lung cancer Paternal Uncle   ? Breast cancer Maternal Grandmother   ? ? ?Social History  ? ?Socioeconomic History  ? Marital status: Single  ?  Spouse name: Not on file  ? Number of children: Not on file  ? Years of education: Not on file  ? Highest education level: Not on file  ?Occupational History  ? Not on file  ?Tobacco Use  ? Smoking status: Never  ?  Passive exposure: Never  ? Smokeless tobacco: Never  ?Vaping Use  ? Vaping Use: Every day  ? Substances: Nicotine  ?Substance and Sexual Activity  ? Alcohol use: Yes  ?  Alcohol/week: 10.0 standard drinks  ?  Types: 10 Standard drinks or equivalent per week  ?   Comment: Weekends  ? Drug use: Never  ? Sexual activity: Yes  ?  Partners: Female, Female  ?  Birth control/protection: Condom  ?Other Topics Concern  ? Not on file  ?Social History Narrative  ? Not on file  ? ?Social Determinants of Health  ? ?Financial Resource Strain: Not on file  ?Food Insecurity: Not on file  ?Transportation Needs: Not on file  ?Physical Activity: Not on file  ?Stress: Not on file  ?Social Connections: Not on file  ?Intimate Partner Violence: Not on file  ? ? ?Outpatient Medications Prior to Visit  ?Medication Sig Dispense Refill  ? albuterol (VENTOLIN HFA) 108 (90 Base) MCG/ACT inhaler Inhale 1-2 puffs into the lungs every 6 (six) hours as needed for wheezing.    ? Cholecalciferol (VITAMIN D3) 50 MCG (2000 UT) TABS Take 1 tablet by mouth daily.    ? Cyanocobalamin (VITAMIN B12) 1000 MCG TBCR Take 1,000 mcg by mouth daily.    ? Fish Oil-Cholecalciferol (FISH OIL + D3) 1200-1000 MG-UNIT CAPS Take 2,400 mg by mouth daily at 6 (six) AM.    ? fluticasone (FLONASE) 50 MCG/ACT nasal spray Place 1 spray into both  nostrils daily as needed for allergies.    ? SPRINTEC 28 0.25-35 MG-MCG tablet Take 1 tablet by mouth daily.    ? vitamin C (ASCORBIC ACID) 250 MG tablet Take 500 mg by mouth daily.    ? vitamin E 45 MG (100 UNITS) capsule Take 180 Units by mouth daily.    ? Zinc 50 MG TABS Take 50 mg by mouth daily at 6 (six) AM.    ? naproxen (NAPROSYN) 500 MG tablet TAKE 1 TABLET BY MOUTH 2 TIMES DAILY WITH A MEAL. 60 tablet 0  ? traMADol (ULTRAM) 50 MG tablet Take 1-2 tablets (50-100 mg total) by mouth every 8 (eight) hours. 84 tablet 0  ? Vitamin D, Ergocalciferol, (DRISDOL) 1.25 MG (50000 UNIT) CAPS capsule TAKE 1 CAPSULE (50,000 UNITS TOTAL) BY MOUTH EVERY 7 (SEVEN) DAYS FOR 12 DOSES. 12 capsule 0  ? ?No facility-administered medications prior to visit.  ? ? ?Allergies  ?Allergen Reactions  ? Morphine Sulfate Other (See Comments)  ?  Causes anaphylaxis in mom  ? Cephalexin Hives and Nausea Only  ?  Clindamycin/Lincomycin Hives, Rash and Other (See Comments)  ?  Abd Pain  ? Latex Itching and Rash  ? ? ?Review of Systems ? ?   ?Objective:  ?  ?Physical Exam ?Vitals reviewed.  ?Constitutional:   ?   Appearance: She is well-developed.  ?HENT:  ?   Head: Normocephalic and atraumatic.  ?Eyes:  ?   Conjunctiva/sclera: Conjunctivae normal.  ?Pulmonary:  ?   Effort: Pulmonary effort is normal.  ?Musculoskeletal:  ?   Comments: Mild tenderness over the lumbar spine. No paraspinous tenderness. Pain with hip flexion over the low right back.  No pain with external hip rotation or hip extension.  ?  ?Skin: ?   General: Skin is dry.  ?   Coloration: Skin is not pale.  ?   Comments: No skin changes or bruising.   ?Neurological:  ?   Mental Status: She is alert and oriented to person, place, and time.  ?Psychiatric:     ?   Behavior: Behavior normal.  ? ? ?BP 117/82   Pulse 99   Ht 5' 5"  (1.651 m)   Wt 209 lb (94.8 kg)   SpO2 98%   BMI 34.78 kg/m?  ?Wt Readings from Last 3 Encounters:  ?10/11/21 209 lb (94.8 kg)  ?07/19/21 226 lb (102.5 kg)  ?06/24/21 218 lb 9.6 oz (99.2 kg)  ? ? ?Health Maintenance Due  ?Topic Date Due  ? HIV Screening  Never done  ? Hepatitis C Screening  Never done  ? PAP SMEAR-Modifier  11/11/2017  ? COVID-19 Vaccine (3 - Booster for Pfizer series) 11/11/2019  ? ? ?There are no preventive care reminders to display for this patient. ? ? ?No results found for: TSH ?Lab Results  ?Component Value Date  ? WBC 7.2 06/24/2021  ? HGB 15.0 06/24/2021  ? HCT 43.9 06/24/2021  ? MCV 98.2 06/24/2021  ? PLT 300 06/24/2021  ? ?Lab Results  ?Component Value Date  ? NA 139 06/24/2021  ? K 4.7 06/24/2021  ? CO2 27 06/24/2021  ? GLUCOSE 78 06/24/2021  ? BUN 13 06/24/2021  ? CREATININE 0.78 06/24/2021  ? BILITOT 0.8 06/24/2021  ? AST 32 (H) 06/24/2021  ? ALT 34 (H) 06/24/2021  ? PROT 7.4 06/24/2021  ? CALCIUM 10.2 06/24/2021  ? EGFR 103 06/24/2021  ? ?No results found for: CHOL ?No results found for: HDL ?No results  found for:  [place] ?No results found for: TRIG ?No results found for: CHOLHDL ?No results found for: HGBA1C ? ?   ?Assessment & Plan:  ? ?Problem List Items Addressed This Visit   ?None ?Visit Diagnoses   ? ? Acute right-sided low back pain with right-sided sciatica    -  Primary  ? Relevant Medications  ? HYDROcodone-acetaminophen (NORCO/VICODIN) 5-325 MG tablet  ? gabapentin (NEURONTIN) 100 MG capsule  ? ?  ? ?Pain increased after having had an epidural/nerve block-unclear etiology she certainly could have a small hematoma pressing on the area that could be causing some increased pain.  For the short-term we will work on managing her pain with hydrocodone and gabapentin.  She did get a little nausea when she tried gabapentin in the past so she can stop it if she notices any symptoms.  Continue with ice as needed since that does seem to help.  Has follow-up with sports med on Friday. ? ?Meds ordered this encounter  ?Medications  ? HYDROcodone-acetaminophen (NORCO/VICODIN) 5-325 MG tablet  ?  Sig: Take 1 tablet by mouth every 6 (six) hours as needed for up to 5 days for moderate pain.  ?  Dispense:  30 tablet  ?  Refill:  0  ? gabapentin (NEURONTIN) 100 MG capsule  ?  Sig: Take 1 capsule (100 mg total) by mouth 3 (three) times daily. Ok to increase to 2 at bedtime if needed.  ?  Dispense:  90 capsule  ?  Refill:  0  ? ? ? ?Beatrice Lecher, MD ? ?

## 2021-10-11 NOTE — Progress Notes (Signed)
Pt reports that she recently received a nerve block on Thursday after getting her MRI. She stated that she has been in worse pain since receiving the injection. She hasn't been able to sleep, it hurts her to stand, walk or lie down. She thought that she was going to get a spinal epidural?  ? ?She has tried using the Tramadol for pain and this didn't work at all. She also tried heat/ice, her TENS unit this provided some relief but not much.  ? ?She tried to get in with Dr. Darene Lamer however she wasn't able she has an appointment scheduled with him on Friday for f/u.  ?

## 2021-10-13 ENCOUNTER — Telehealth: Payer: Self-pay

## 2021-10-13 NOTE — Telephone Encounter (Signed)
Initiated Prior authorization [id number] 5-325MG  tablets ?Via: Covermymeds ?Case/Key: [id number] ?Status: Pending as of 10/13/21 ?Reason: ?Notified Pt via: Mychart ?

## 2021-10-15 ENCOUNTER — Ambulatory Visit (INDEPENDENT_AMBULATORY_CARE_PROVIDER_SITE_OTHER): Payer: BC Managed Care – PPO | Admitting: Sports Medicine

## 2021-10-15 DIAGNOSIS — M5416 Radiculopathy, lumbar region: Secondary | ICD-10-CM | POA: Diagnosis not present

## 2021-10-15 MED ORDER — GABAPENTIN 300 MG PO CAPS: ORAL_CAPSULE | ORAL | 3 refills | Status: DC

## 2021-10-15 NOTE — Progress Notes (Signed)
? ? ?  Procedures performed today:   ? ?None. ? ?Independent interpretation of notes and tests performed by another provider:  ? ?None. ? ?Brief History, Exam, Impression, and Recommendations:   ? ?Right lumbar radiculitis ?Pleasant 31 year old female, chronic axial low back pain with occasional radiation down the right leg. ?We finally obtained an MRI that showed a very large L5-S1 disc extrusion compressing the right S1 nerve root. ?We did proceed with a selective S1 epidural, has not really had much relief. ?I do think we should proceed with the next epidural, this time we will do an L5-S1 interlaminar approach. ?Continue hydrocodone for now, I am happy to refill this when she needs it, increasing gabapentin to 300 mg 3 times daily, and I would like to get 2 surgical opinions. ?Return to see me 4 weeks after the next epidural. ? ? ? ?___________________________________________ ?Ihor Austin. Benjamin Stain, M.D., ABFM., CAQSM. ?Primary Care and Sports Medicine ?[facility] ? ?Adjunct Instructor of Family Medicine  ?[employer] ?

## 2021-10-15 NOTE — Assessment & Plan Note (Signed)
Pleasant 31 year old female, chronic axial low back pain with occasional radiation down the right leg. ?We finally obtained an MRI that showed a very large L5-S1 disc extrusion compressing the right S1 nerve root. ?We did proceed with a selective S1 epidural, has not really had much relief. ?I do think we should proceed with the next epidural, this time we will do an L5-S1 interlaminar approach. ?Continue hydrocodone for now, I am happy to refill this when she needs it, increasing gabapentin to 300 mg 3 times daily, and I would like to get 2 surgical opinions. ?Return to see me 4 weeks after the next epidural. ?

## 2021-10-19 ENCOUNTER — Encounter: Payer: Self-pay | Admitting: Sports Medicine

## 2021-10-19 MED ORDER — HYDROCODONE-ACETAMINOPHEN 10-325 MG PO TABS: 0.5000 | ORAL_TABLET | Freq: Four times a day (QID) | ORAL | 0 refills | Status: DC | PRN

## 2021-10-20 ENCOUNTER — Telehealth: Payer: Self-pay

## 2021-10-20 NOTE — Telephone Encounter (Addendum)
Initiated Prior authorization for: HYDROcodone-Acetaminophen 10-325MG  tablets ?Via: Covermymeds ?Case/Key:[id number] ?Status: approved  as of 10/20/21 ?Reason:10/20/2021 - 04/21/2022 ? ?Notified Pt via: Mychart  ?

## 2021-10-22 ENCOUNTER — Ambulatory Visit
Admission: RE | Admit: 2021-10-22 | Discharge: 2021-10-22 | Disposition: A | Discharge status: Home / Self Care | Payer: BC Managed Care – PPO | Source: Ambulatory Visit | Attending: Sports Medicine | Admitting: Sports Medicine

## 2021-10-22 DIAGNOSIS — M5416 Radiculopathy, lumbar region: Secondary | ICD-10-CM

## 2021-10-22 MED ORDER — IOPAMIDOL (ISOVUE-M 200) INJECTION 41%
1.0000 mL | Freq: Once | INTRAMUSCULAR | Status: AC
  Administered 2021-10-22: 1 mL via EPIDURAL

## 2021-10-22 MED ORDER — METHYLPREDNISOLONE ACETATE 40 MG/ML INJ SUSP (RADIOLOG
80.0000 mg | Freq: Once | INTRAMUSCULAR | Status: AC
  Administered 2021-10-22: 80 mg via EPIDURAL

## 2021-10-22 NOTE — Discharge Instructions (Signed)

## 2021-10-25 ENCOUNTER — Encounter: Payer: Self-pay | Admitting: Sports Medicine

## 2021-10-26 ENCOUNTER — Encounter: Payer: Self-pay | Admitting: Sports Medicine

## 2021-11-05 ENCOUNTER — Encounter (INDEPENDENT_AMBULATORY_CARE_PROVIDER_SITE_OTHER): Payer: BC Managed Care – PPO | Admitting: Sports Medicine

## 2021-11-05 DIAGNOSIS — M5416 Radiculopathy, lumbar region: Secondary | ICD-10-CM

## 2021-11-05 MED ORDER — HYDROCODONE-ACETAMINOPHEN 10-325 MG PO TABS: 0.5000 | ORAL_TABLET | Freq: Four times a day (QID) | ORAL | 0 refills | Status: DC | PRN

## 2021-11-05 NOTE — Telephone Encounter (Signed)
I spent 5 total minutes of online digital evaluation and management services in this patient-initiated request for online care. 

## 2021-11-10 HISTORY — PX: LAMINECTOMY: SHX219

## 2021-11-15 ENCOUNTER — Encounter: Payer: Self-pay | Admitting: Sports Medicine

## 2021-11-18 ENCOUNTER — Encounter: Payer: Self-pay | Admitting: Family Medicine

## 2021-12-03 ENCOUNTER — Ambulatory Visit: Payer: BC Managed Care – PPO | Admitting: Sports Medicine

## 2021-12-03 ENCOUNTER — Ambulatory Visit (INDEPENDENT_AMBULATORY_CARE_PROVIDER_SITE_OTHER): Payer: BC Managed Care – PPO

## 2021-12-03 ENCOUNTER — Encounter: Payer: Self-pay | Admitting: Sports Medicine

## 2021-12-03 DIAGNOSIS — M5416 Radiculopathy, lumbar region: Secondary | ICD-10-CM | POA: Diagnosis not present

## 2021-12-03 MED ORDER — IBUPROFEN 800 MG PO TABS: 800.0000 mg | ORAL_TABLET | Freq: Three times a day (TID) | ORAL | 2 refills | Status: DC | PRN

## 2021-12-03 MED ORDER — PREDNISONE 50 MG PO TABS: ORAL_TABLET | ORAL | 0 refills | Status: DC

## 2021-12-03 NOTE — Progress Notes (Signed)
    Procedures performed today:    None.  Independent interpretation of notes and tests performed by another provider:   None.  Brief History, Exam, Impression, and Recommendations:    Right lumbar radiculitis Pleasant 31 year old female, she did have lumbar degenerative disc disease with a very large L5-S1 disc extrusion compressing the right S1 nerve root. After failure of conservative treatment including epidurals she ended up with a microdiscectomy with Dr. Alvester Morin. 1 month postop now, having some back pain still, with radiation down the left leg. Incision looks good. She is off of gabapentin now. I did advise that this was not uncommon this close to surgery particular considering that nerves have been manipulated and part of the lamina excised. We will add a 5-day burst of prednisone followed by ibuprofen 800 3 times daily, continue physical therapy. Follow-up does need to be with Dr. Franky Macho. However if insufficient improvement after an additional 1 to 2 months we will consider updated MRI.    ___________________________________________ Ihor Austin. Benjamin Stain, M.D., ABFM., CAQSM. Primary Care and Sports Medicine [facility]  Adjunct Instructor of Family Medicine  [employer]

## 2021-12-03 NOTE — Assessment & Plan Note (Signed)
Pleasant 31 year old female, she did have lumbar degenerative disc disease with a very large L5-S1 disc extrusion compressing the right S1 nerve root. After failure of conservative treatment including epidurals she ended up with a microdiscectomy with Dr. Alvester Morin. 1 month postop now, having some back pain still, with radiation down the left leg. Incision looks good. She is off of gabapentin now. I did advise that this was not uncommon this close to surgery particular considering that nerves have been manipulated and part of the lamina excised. We will add a 5-day burst of prednisone followed by ibuprofen 800 3 times daily, continue physical therapy. Follow-up does need to be with Dr. Franky Macho. However if insufficient improvement after an additional 1 to 2 months we will consider updated MRI.

## 2021-12-21 ENCOUNTER — Encounter: Payer: Self-pay | Admitting: Family Medicine

## 2021-12-21 MED ORDER — ALBUTEROL SULFATE HFA 108 (90 BASE) MCG/ACT IN AERS: 1.0000 | INHALATION_SPRAY | Freq: Four times a day (QID) | RESPIRATORY_TRACT | 2 refills | Status: DC | PRN

## 2021-12-27 ENCOUNTER — Ambulatory Visit: Payer: BC Managed Care – PPO | Admitting: Family Medicine

## 2021-12-27 ENCOUNTER — Encounter: Payer: Self-pay | Admitting: Family Medicine

## 2021-12-27 VITALS — BP 119/80 | HR 78 | Ht 65.0 in | Wt 202.0 lb

## 2021-12-27 DIAGNOSIS — F419 Anxiety disorder, unspecified: Secondary | ICD-10-CM

## 2021-12-27 DIAGNOSIS — D72829 Elevated white blood cell count, unspecified: Secondary | ICD-10-CM | POA: Diagnosis not present

## 2021-12-27 DIAGNOSIS — L659 Nonscarring hair loss, unspecified: Secondary | ICD-10-CM

## 2021-12-27 DIAGNOSIS — M5416 Radiculopathy, lumbar region: Secondary | ICD-10-CM

## 2021-12-27 NOTE — Patient Instructions (Addendum)
Anxiety:  OTC remedies:  Magnesium supplement Valerian root  Rx options:  SSRI medications (lexapro, zoloft, etc).  Take every day to be effective.   Hydroxyzine (take as needed)-Can make you drowsy  Buspar-Can take as needed or on a scheduled basis.

## 2021-12-28 LAB — CBC WITH DIFFERENTIAL/PLATELET
Absolute Monocytes: 470 cells/uL (ref 200–950)
Basophils Absolute: 59 cells/uL (ref 0–200)
Basophils Relative: 1.2 %
Eosinophils Absolute: 270 cells/uL (ref 15–500)
Eosinophils Relative: 5.5 %
HCT: 38 % (ref 35.0–45.0)
Hemoglobin: 13.2 g/dL (ref 11.7–15.5)
Lymphs Abs: 2014 cells/uL (ref 850–3900)
MCH: 33.8 pg — ABNORMAL HIGH (ref 27.0–33.0)
MCHC: 34.7 g/dL (ref 32.0–36.0)
MCV: 97.2 fL (ref 80.0–100.0)
MPV: 10.4 fL (ref 7.5–12.5)
Monocytes Relative: 9.6 %
Neutro Abs: 2087 cells/uL (ref 1500–7800)
Neutrophils Relative %: 42.6 %
Platelets: 267 10*3/uL (ref 140–400)
RBC: 3.91 10*6/uL (ref 3.80–5.10)
RDW: 12 % (ref 11.0–15.0)
Total Lymphocyte: 41.1 %
WBC: 4.9 10*3/uL (ref 3.8–10.8)

## 2021-12-28 NOTE — Assessment & Plan Note (Signed)
She is having some anxiety but is not ready to start anything at this time.  She is possibly interested in counseling in the future.  We did discuss options for medication including over-the-counter remedies and prescription remedies.  She will look into these and let me know if she would like to try any of these.

## 2021-12-28 NOTE — Progress Notes (Signed)
Joy Rogers - 31 y.o. female MRN [id number]  Date of birth: March 20, 1991  Subjective Chief Complaint  Patient presents with   Anxiety   Alopecia    HPI Joy Rogers is a 31 year old female here today for follow-up visit.  She is status post microdiscectomy with Dr. Franky Macho in May.  Overall doing much better.  She has still have some pain from time to time.  This is usually controlled with ibuprofen.  She has not required hydrocodone or gabapentin.  She has noticed some hair loss since having surgery.  Thinks this may be related to anesthesia from her surgery.   She has had some increased anxiety as well.  She recalls trying SSRI medication as a teenager but does not recall what this was.  She does recall that she did not tolerate this well and felt like a zombie while taking this.  She would be interested in medication that she can take as needed or over-the-counter remedies that may be helpful.  She does have some difficulty sleeping related to her anxiety.  ROS:  A comprehensive ROS was completed and negative except as noted per HPI    Allergies  Allergen Reactions   Morphine Sulfate Other (See Comments)    Causes anaphylaxis in mom   Cephalexin Hives and Nausea Only   Clindamycin/Lincomycin Hives, Rash and Other (See Comments)    Abd Pain   Latex Itching and Rash    Past Medical History:  Diagnosis Date   Anxiety    Asthma    Ovarian cyst     Past Surgical History:  Procedure Laterality Date   TONSILLECTOMY     WISDOM TOOTH EXTRACTION      Social History   Socioeconomic History   Marital status: Single    Spouse name: Not on file   Number of children: Not on file   Years of education: Not on file   Highest education level: Not on file  Occupational History   Not on file  Tobacco Use   Smoking status: Never    Passive exposure: Never   Smokeless tobacco: Never  Vaping Use   Vaping Use: Every day   Substances: Nicotine  Substance and Sexual Activity    Alcohol use: Yes    Alcohol/week: 10.0 standard drinks of alcohol    Types: 10 Standard drinks or equivalent per week    Comment: Weekends   Drug use: Never   Sexual activity: Yes    Partners: Female, Female    Birth control/protection: Condom  Other Topics Concern   Not on file  Social History Narrative   Not on file   Social Determinants of Health   Financial Resource Strain: Not on file  Food Insecurity: Not on file  Transportation Needs: Not on file  Physical Activity: Not on file  Stress: Not on file  Social Connections: Not on file    Family History  Problem Relation Age of Onset   Hypertension Mother    Lung cancer Father    Breast cancer Maternal Aunt    Lung cancer Paternal Uncle    Breast cancer Maternal Grandmother     Health Maintenance  Topic Date Due   HIV Screening  Never done   Hepatitis C Screening  Never done   PAP SMEAR-Modifier  11/11/2017   COVID-19 Vaccine (3 - Pfizer series) 11/11/2019   INFLUENZA VACCINE  01/25/2022   TETANUS/TDAP  07/29/2026   HPV VACCINES  Aged Out     ----------------------------------------------------------------------------------------------------------------------------------------------------------------------------------------------------------------- Physical Exam  BP 119/80 (BP Location: Left Arm, Patient Position: Sitting, Cuff Size: Normal)   Pulse 78   Ht 5\' 5"  (1.651 m)   Wt 202 lb (91.6 kg)   SpO2 98%   BMI 33.61 kg/m   Physical Exam Constitutional:      Appearance: Normal appearance.  Eyes:     General: No scleral icterus. Musculoskeletal:     Cervical back: Neck supple.  Neurological:     Mental Status: She is alert.  Psychiatric:        Mood and Affect: Mood normal.        Behavior: Behavior normal.      ------------------------------------------------------------------------------------------------------------------------------------------------------------------------------------------------------------------- Assessment and Plan  Anxiety She is having some anxiety but is not ready to start anything at this time.  She is possibly interested in counseling in the future.  We did discuss options for medication including over-the-counter remedies and prescription remedies.  She will look into these and let me know if she would like to try any of these.  Right lumbar radiculitis Overall this is improved since her surgery.  She does continue to have some pain in the lower back but this usually resolves with ibuprofen.   No orders of the defined types were placed in this encounter.   No follow-ups on file.    This visit occurred during the SARS-CoV-2 public health emergency.  Safety protocols were in place, including screening questions prior to the visit, additional usage of staff PPE, and extensive cleaning of exam room while observing appropriate contact time as indicated for disinfecting solutions.

## 2021-12-28 NOTE — Assessment & Plan Note (Signed)
Overall this is improved since her surgery.  She does continue to have some pain in the lower back but this usually resolves with ibuprofen.

## 2022-01-06 ENCOUNTER — Encounter (INDEPENDENT_AMBULATORY_CARE_PROVIDER_SITE_OTHER): Payer: BC Managed Care – PPO | Admitting: Sports Medicine

## 2022-01-06 DIAGNOSIS — M5416 Radiculopathy, lumbar region: Secondary | ICD-10-CM | POA: Diagnosis not present

## 2022-01-07 NOTE — Telephone Encounter (Signed)
I spent 5 total minutes of online digital evaluation and management services in this patient-initiated request for online care. 

## 2022-01-12 ENCOUNTER — Telehealth: Payer: Self-pay

## 2022-01-12 NOTE — Telephone Encounter (Signed)
Insurance denied approval for imaging order. Appeal and/or peer to peer required by provider. Please call [phone]. Case/Ref # [id number].

## 2022-01-12 NOTE — Telephone Encounter (Signed)
They are continuing to deny the MRI, she needs to do this the right way, through her surgeon rather than relying on me to clean up the mess.  She also needs an appointment before they will consider approving an MRI.

## 2022-01-12 NOTE — Telephone Encounter (Signed)
Left a detailed vm msg for patient regarding provider's recommendation and insurance outcome for MRI. Informed that auth for MRI was denied by her insurance. Patient informed to reach out to her surgeon for the MRI study. Patient informed to send a Mychart msg or call if she has any questions. Direct call back info provided.

## 2022-01-13 ENCOUNTER — Ambulatory Visit: Payer: BC Managed Care – PPO | Admitting: Family Medicine

## 2022-01-21 ENCOUNTER — Emergency Department
Admission: RE | Admit: 2022-01-21 | Discharge: 2022-01-21 | Disposition: A | Discharge status: Home / Self Care | Payer: BC Managed Care – PPO | Source: Ambulatory Visit | Attending: Family Medicine | Admitting: Family Medicine

## 2022-01-21 ENCOUNTER — Encounter: Payer: Self-pay | Admitting: Family Medicine

## 2022-01-21 ENCOUNTER — Emergency Department (INDEPENDENT_AMBULATORY_CARE_PROVIDER_SITE_OTHER): Payer: BC Managed Care – PPO

## 2022-01-21 VITALS — BP 132/89 | HR 65 | Temp 98.7°F | Resp 18 | Ht 65.0 in | Wt 200.0 lb

## 2022-01-21 DIAGNOSIS — R1012 Left upper quadrant pain: Secondary | ICD-10-CM

## 2022-01-21 MED ORDER — IOHEXOL 300 MG/ML  SOLN
100.0000 mL | Freq: Once | INTRAMUSCULAR | Status: AC | PRN
  Administered 2022-01-21: 80 mL via INTRAVENOUS

## 2022-01-21 NOTE — ED Triage Notes (Signed)
Patient c/o RUQ x 2 days, pain comes and goes, causing heartburn, nausea, no vomiting or diarrhea.  Patient has been taken Ibuprofen 800mg  2 times daily since December 03, 2021 due to residual leg pain from recent surgery.  Patient is concern for possible stomach ulcer.  Pt tried taking TUMS around noon w/minimal relief.

## 2022-01-21 NOTE — ED Provider Notes (Signed)
[facility]    CSN: [id number] Arrival date & time: 01/21/22  1545      History   Chief Complaint Chief Complaint  Patient presents with   Nausea    Sharp stomach pains on left side (for two ish days), heart burn and chest pain Afraid it's a stomach ulcer-have been taking 800 mg prescription ibuprofen 2x a day for a little over a month for post surgical pain - Entered by patient   Abdominal Pain    HPI Joy Rogers is a 31 y.o. female.   HPI 31 year old female presents with sharp intense stomach pains; specifically, right upper quadrant on the left side for 2 days.  Reports has been taking 800 mg prescription ibuprofen twice a day for a little over a month for postsurgical pain.  Patient is consulted with possible ulceration.  PMH significant for morbid obesity, asthma, and ovarian cyst.  Past Medical History:  Diagnosis Date   Anxiety    Asthma    Ovarian cyst     Patient Active Problem List   Diagnosis Date Noted   Migraine with aura 06/24/2021   Generalized anxiety disorder 06/24/2021   Other seasonal allergic rhinitis 06/24/2021   Anxiety 06/24/2021   Amenorrhea 06/24/2021   Other fatigue 06/24/2021   Right lumbar radiculitis 06/24/2021    Past Surgical History:  Procedure Laterality Date   LAMINECTOMY N/A 11/10/2021   Partial Disectomy on L5 & S1   TONSILLECTOMY     WISDOM TOOTH EXTRACTION      OB History   No obstetric history on file.      Home Medications    Prior to Admission medications   Medication Sig Start Date End Date Taking? Authorizing Provider  albuterol (VENTOLIN HFA) 108 (90 Base) MCG/ACT inhaler Inhale 1-2 puffs into the lungs every 6 (six) hours as needed for wheezing. 12/21/21  Yes Everrett Coombe, DO  Cholecalciferol (VITAMIN D3) 50 MCG (2000 UT) TABS Take 1 tablet by mouth daily.   Yes [provider]  fluticasone (FLONASE) 50 MCG/ACT nasal spray Place 1 spray into both nostrils daily as needed for allergies.    Yes [provider]  ibuprofen (ADVIL) 800 MG tablet Take 1 tablet (800 mg total) by mouth every 8 (eight) hours as needed. 12/03/21  Yes Monica Becton, MD  SPRINTEC 28 0.25-35 MG-MCG tablet Take 1 tablet by mouth daily. 06/15/21  Yes [provider]  vitamin C (ASCORBIC ACID) 250 MG tablet Take 500 mg by mouth daily.   Yes [provider]  HYDROcodone-acetaminophen (NORCO) 10-325 MG tablet Take 0.5-1 tablets by mouth every 6 (six) hours as needed for up to 5 days. 11/05/21 11/10/21  Monica Becton, MD    Family History Family History  Problem Relation Age of Onset   Hypertension Mother    Lung cancer Father    Breast cancer Maternal Grandmother    Breast cancer Maternal Aunt    Lung cancer Paternal Uncle     Social History Social History   Tobacco Use   Smoking status: Never    Passive exposure: Never   Smokeless tobacco: Never  Vaping Use   Vaping Use: Every day   Substances: Nicotine  Substance Use Topics   Alcohol use: Yes    Alcohol/week: 10.0 standard drinks of alcohol    Types: 10 Standard drinks or equivalent per week    Comment: Weekends   Drug use: Never     Allergies   Morphine sulfate, Cephalexin,  Clindamycin/lincomycin, and Latex   Review of Systems Review of Systems  Gastrointestinal:  Positive for abdominal pain.  All other systems reviewed and are negative.    Physical Exam Triage Vital Signs ED Triage Vitals  Enc Vitals Group     BP      Pulse      Resp      Temp      Temp src      SpO2      Weight      Height      Head Circumference      Peak Flow      Pain Score      Pain Loc      Pain Edu?      Excl. in GC?    No data found.  Updated Vital Signs BP 132/89 (BP Location: Right Arm)   Pulse 65   Temp 98.7 F (37.1 C) (Oral)   Resp 18   Ht 5\' 5"  (1.651 m)   Wt 200 lb (90.7 kg)   LMP 01/16/2022   SpO2 100%   BMI 33.28 kg/m   Physical Exam Vitals and nursing note reviewed.   Constitutional:      General: She is not in acute distress.    Appearance: Normal appearance. She is obese. She is not ill-appearing.  HENT:     Head: Normocephalic and atraumatic.     Mouth/Throat:     Mouth: Mucous membranes are moist.     Pharynx: Oropharynx is clear.  Eyes:     Extraocular Movements: Extraocular movements intact.     Conjunctiva/sclera: Conjunctivae normal.     Pupils: Pupils are equal, round, and reactive to light.  Cardiovascular:     Rate and Rhythm: Normal rate and regular rhythm.     Pulses: Normal pulses.     Heart sounds: Normal heart sounds. No murmur heard. Pulmonary:     Effort: Pulmonary effort is normal.     Breath sounds: Normal breath sounds. No wheezing, rhonchi or rales.  Abdominal:     General: Abdomen is flat. Bowel sounds are normal. There is no distension or abdominal bruit. There are no signs of injury.     Palpations: There is no shifting dullness, fluid wave, hepatomegaly, splenomegaly, mass or pulsatile mass.     Tenderness: There is abdominal tenderness in the left upper quadrant. There is no right CVA tenderness, left CVA tenderness, guarding or rebound. Negative signs include Murphy's sign and McBurney's sign.     Hernia: No hernia is present.     Comments: Once downstairs in CT suite patient became anxious and crying per rad tech and decided not to do IV contrast but would do oral contrast.  Advised tech to cancel study and have patient return to exam room.  Rad tech called me back shortly after and stated that patient decided to IV contrast now.  Musculoskeletal:     Cervical back: Normal range of motion and neck supple.  Skin:    General: Skin is warm and dry.  Neurological:     General: No focal deficit present.     Mental Status: She is alert and oriented to person, place, and time.      UC Treatments / Results  Labs (all labs ordered are listed, but only abnormal results are displayed) Labs Reviewed - No data to  display  EKG   Radiology CT ABDOMEN PELVIS W CONTRAST  Result Date: 01/21/2022 CLINICAL DATA:  Left upper quadrant abdominal  pain. EXAM: CT ABDOMEN AND PELVIS WITH CONTRAST TECHNIQUE: Multidetector CT imaging of the abdomen and pelvis was performed using the standard protocol following bolus administration of intravenous contrast. RADIATION DOSE REDUCTION: This exam was performed according to the departmental dose-optimization program which includes automated exposure control, adjustment of the mA and/or kV according to patient size and/or use of iterative reconstruction technique. CONTRAST:  60mL OMNIPAQUE IOHEXOL 300 MG/ML  SOLN COMPARISON:  None Available. FINDINGS: Lower chest: There is a 6 mm ground-glass nodule in the right lower lobe. Hepatobiliary: No focal liver abnormality is seen. No gallstones, gallbladder wall thickening, or biliary dilatation. Pancreas: Unremarkable. No pancreatic ductal dilatation or surrounding inflammatory changes. Spleen: Normal in size without focal abnormality. Adrenals/Urinary Tract: Adrenal glands are unremarkable. Kidneys are normal, without renal calculi, focal lesion, or hydronephrosis. Bladder is unremarkable. Stomach/Bowel: Stomach is within normal limits. Appendix appears normal. No evidence of bowel wall thickening, distention, or inflammatory changes. Vascular/Lymphatic: No significant vascular findings are present. No enlarged abdominal or pelvic lymph nodes. Reproductive: Uterus and bilateral adnexa are unremarkable. Other: No abdominal wall hernia or abnormality. No abdominopelvic ascites. Musculoskeletal: No acute or significant osseous findings. IMPRESSION: 1. No acute process in the abdomen or pelvis. 2. 6 mm right lower lobe ground-glass nodule. Initial follow-up with CT at 6 months is recommended to confirm persistence. If persistent, repeat CT is recommended every 2 years until 5 years of stability has been established. This recommendation follows the  consensus statement: Guidelines for Management of Incidental Pulmonary Nodules Detected on CT Images: From the Fleischner Society 2017; Radiology 2017; 284:228-243. Electronically Signed   By: Darliss Cheney M.D.   On: 01/21/2022 17:29    Procedures Procedures (including critical care time)  Medications Ordered in UC Medications - No data to display  Initial Impression / Assessment and Plan / UC Course  I have reviewed the triage vital signs and the nursing notes.  Pertinent labs & imaging results that were available during my care of the patient were reviewed by me and considered in my medical decision making (see chart for details).     MDM: 1.  Left upper quadrant abdominal pain-CT of abdomen and pelvis revealed above. Informed/advised patient of CT of abdomen and pelvis with contrast results with hard copy provided to patient. Advised patient if symptoms worsen and/or unresolved please follow-up with PCP or here for further evaluation.  Final Clinical Impressions(s) / UC Diagnoses   Final diagnoses:  Left upper quadrant abdominal pain     Discharge Instructions      Informed/advised patient of CT of abdomen and pelvis with contrast results with hard copy provided to patient.  Advised radiology recommended follow-up CT of chest giving finding today of right lower lobe.  Advised patient if symptoms worsen and/or unresolved please follow-up with PCP or here for further evaluation.     ED Prescriptions   None    PDMP not reviewed this encounter.   Trevor Iha, FNP 01/21/22 1758

## 2022-01-21 NOTE — Discharge Instructions (Addendum)
Informed/advised patient of CT of abdomen and pelvis with contrast results with hard copy provided to patient.  Advised radiology recommended follow-up CT of chest giving finding today of right lower lobe.  Advised patient if symptoms worsen and/or unresolved please follow-up with PCP or here for further evaluation.

## 2022-01-24 ENCOUNTER — Ambulatory Visit (INDEPENDENT_AMBULATORY_CARE_PROVIDER_SITE_OTHER): Payer: BC Managed Care – PPO

## 2022-01-24 DIAGNOSIS — M5416 Radiculopathy, lumbar region: Secondary | ICD-10-CM | POA: Diagnosis not present

## 2022-01-24 MED ORDER — GADOBUTROL 1 MMOL/ML IV SOLN
9.0000 mL | Freq: Once | INTRAVENOUS | Status: AC | PRN
  Administered 2022-01-24: 9 mL via INTRAVENOUS

## 2022-01-25 ENCOUNTER — Encounter: Payer: Self-pay | Admitting: Family Medicine

## 2022-01-25 ENCOUNTER — Ambulatory Visit: Payer: BC Managed Care – PPO | Admitting: Family Medicine

## 2022-01-25 DIAGNOSIS — R911 Solitary pulmonary nodule: Secondary | ICD-10-CM | POA: Diagnosis not present

## 2022-01-25 DIAGNOSIS — R1012 Left upper quadrant pain: Secondary | ICD-10-CM | POA: Insufficient documentation

## 2022-01-25 NOTE — Patient Instructions (Signed)
Nice to see you today! We'll plan to repeat the CT scan in 6 months.  Call if having new symptoms.  If you develop new abdominal pain try adding omeprazole.  You can get this over the counter.  Please call or message with additional questions.

## 2022-01-25 NOTE — Progress Notes (Signed)
Joy Rogers - 31 y.o. female MRN [id number]  Date of birth: 06-23-1991  Subjective No chief complaint on file.   HPI Joy Rogers is a 31 year old female here today for follow-up of recent urgent care visit.  She is seen in urgent care due to abdominal pain.  Pain located in the left upper quadrant area.  She had CT scan completed which did not show any abdominal pathology.  She did try Tums without a whole lot of improvement.  She has been taking ibuprofen twice daily and was concerned about possible ulcer.  She has had quite a bit of improvement improvement since visit however has not fully resolved.  She is most concerned about groundglass nodule of the right lower lobe that was found incidentally on CT scan.  She has concerns due to her father having lung cancer.  Additionally she is a smoker herself.  She has not noted any dyspnea or new cough.  ROS:  A comprehensive ROS was completed and negative except as noted per HPI  Allergies  Allergen Reactions   Morphine Sulfate Other (See Comments)    Causes anaphylaxis in mom   Cephalexin Hives and Nausea Only   Clindamycin/Lincomycin Hives, Rash and Other (See Comments)    Abd Pain   Latex Itching and Rash    Past Medical History:  Diagnosis Date   Anxiety    Asthma    Ovarian cyst     Past Surgical History:  Procedure Laterality Date   LAMINECTOMY N/A 11/10/2021   Partial Disectomy on L5 & S1   TONSILLECTOMY     WISDOM TOOTH EXTRACTION      Social History   Socioeconomic History   Marital status: Single    Spouse name: Not on file   Number of children: Not on file   Years of education: Not on file   Highest education level: Not on file  Occupational History   Not on file  Tobacco Use   Smoking status: Never    Passive exposure: Never   Smokeless tobacco: Never  Vaping Use   Vaping Use: Every day   Substances: Nicotine  Substance and Sexual Activity   Alcohol use: Yes    Alcohol/week: 10.0 standard drinks of  alcohol    Types: 10 Standard drinks or equivalent per week    Comment: Weekends   Drug use: Never   Sexual activity: Yes    Partners: Female, Female    Birth control/protection: Condom  Other Topics Concern   Not on file  Social History Narrative   Not on file   Social Determinants of Health   Financial Resource Strain: Not on file  Food Insecurity: Not on file  Transportation Needs: Not on file  Physical Activity: Not on file  Stress: Not on file  Social Connections: Not on file    Family History  Problem Relation Age of Onset   Hypertension Mother    Lung cancer Father    Breast cancer Maternal Grandmother    Breast cancer Maternal Aunt    Lung cancer Paternal Uncle     Health Maintenance  Topic Date Due   INFLUENZA VACCINE  01/25/2022   PAP SMEAR-Modifier  04/27/2022 (Originally 11/11/2017)   COVID-19 Vaccine (3 - Pfizer series) 04/27/2022 (Originally 11/11/2019)   Hepatitis C Screening  01/26/2023 (Originally 03/03/2007)   HIV Screening  01/26/2023 (Originally 03/02/2004)   TETANUS/TDAP  07/29/2026   HPV VACCINES  Aged Out     ----------------------------------------------------------------------------------------------------------------------------------------------------------------------------------------------------------------- Physical Exam BP 121/75 (BP Location:  Right Arm, Patient Position: Sitting, Cuff Size: Normal)   Pulse 90   Ht 5\' 5"  (1.651 m)   Wt 198 lb (89.8 kg)   LMP 01/16/2022   SpO2 98%   BMI 32.95 kg/m   Physical Exam Eyes:     General: No scleral icterus. Musculoskeletal:     Cervical back: Neck supple.  Neurological:     Mental Status: She is alert.  Psychiatric:        Mood and Affect: Mood normal.        Behavior: Behavior normal.      ------------------------------------------------------------------------------------------------------------------------------------------------------------------------------------------------------------------- Assessment and Plan  Left upper quadrant abdominal pain This is improved quite a bit since her visit to urgent care.  Differential diagnosis includes GERD, peptic ulcer disease, gastritis.  CT scan did not show any significant abdominal pathology.  Recommend adding omeprazole 20 mg daily.  If not improving with this as that she contact the clinic.  Limiting NSAID use recommended as well.  Pulmonary nodule Groundglass nodule noted on recent CT scan.  Denies symptoms at this time.  Provided reassurance to her today.  We will plan to repeat CT scan in 6 months.   No orders of the defined types were placed in this encounter.   No follow-ups on file.  35 minutes spent including pre visit preparation, review of prior notes and labs, encounter with patient  and same day documentation.   This visit occurred during the SARS-CoV-2 public health emergency.  Safety protocols were in place, including screening questions prior to the visit, additional usage of staff PPE, and extensive cleaning of exam room while observing appropriate contact time as indicated for disinfecting solutions.

## 2022-01-25 NOTE — Assessment & Plan Note (Signed)
Groundglass nodule noted on recent CT scan.  Denies symptoms at this time.  Provided reassurance to her today.  We will plan to repeat CT scan in 6 months.

## 2022-01-25 NOTE — Assessment & Plan Note (Addendum)
This is improved quite a bit since her visit to urgent care.  Differential diagnosis includes GERD, peptic ulcer disease, gastritis.  CT scan did not show any significant abdominal pathology.  Recommend adding omeprazole 20 mg daily.  If not improving with this as that she contact the clinic.  Limiting NSAID use recommended as well.

## 2022-02-27 ENCOUNTER — Other Ambulatory Visit: Payer: Self-pay | Admitting: Sports Medicine

## 2022-02-27 DIAGNOSIS — M5416 Radiculopathy, lumbar region: Secondary | ICD-10-CM

## 2022-03-29 ENCOUNTER — Ambulatory Visit: Payer: BC Managed Care – PPO

## 2022-04-21 ENCOUNTER — Encounter: Payer: Self-pay | Admitting: Family Medicine

## 2022-04-26 ENCOUNTER — Ambulatory Visit: Payer: BC Managed Care – PPO | Admitting: Family Medicine

## 2022-04-26 ENCOUNTER — Encounter: Payer: Self-pay | Admitting: Family Medicine

## 2022-04-26 VITALS — BP 124/77 | HR 94 | Ht 65.0 in | Wt 202.0 lb

## 2022-04-26 DIAGNOSIS — J329 Chronic sinusitis, unspecified: Secondary | ICD-10-CM | POA: Diagnosis not present

## 2022-04-26 DIAGNOSIS — J40 Bronchitis, not specified as acute or chronic: Secondary | ICD-10-CM

## 2022-04-26 DIAGNOSIS — R6889 Other general symptoms and signs: Secondary | ICD-10-CM | POA: Insufficient documentation

## 2022-04-26 MED ORDER — DOXYCYCLINE HYCLATE 100 MG PO TABS: 100.0000 mg | ORAL_TABLET | Freq: Two times a day (BID) | ORAL | 0 refills | Status: DC

## 2022-04-26 MED ORDER — AMOXICILLIN 875 MG PO TABS: 875.0000 mg | ORAL_TABLET | Freq: Two times a day (BID) | ORAL | 0 refills | Status: DC

## 2022-04-26 NOTE — Progress Notes (Signed)
d  Acute Office Visit  Subjective:     Patient ID: Joy Rogers, female    DOB: 10/10/1990, 31 y.o.   MRN: [id number]  Chief Complaint  Patient presents with   Sore Throat   Nasal Congestion    HPI Patient is in today for cough and flu like symptoms. Pt admits to fevers and said her tmax was 100.7. She did take ibuprofen to help control fevers. Admits to body aches. Pt also has sinus congestion and pressure.   Review of Systems  Constitutional:  Negative for chills and fever.  HENT:  Positive for congestion and sinus pain.   Respiratory:  Negative for cough and shortness of breath.   Cardiovascular:  Negative for chest pain.  Neurological:  Negative for headaches.        Objective:    BP 124/77   Pulse 94   Ht 5\' 5"  (1.651 m)   Wt 202 lb (91.6 kg)   SpO2 100%   BMI 33.61 kg/m    Physical Exam Vitals and nursing note reviewed.  Constitutional:      General: She is not in acute distress.    Appearance: Normal appearance.  HENT:     Head: Normocephalic and atraumatic.     Comments: Tenderness to palpation of maxillary and frontal sinuses bilaterally    Right Ear: Ear canal and external ear normal. Tympanic membrane is erythematous.     Left Ear: Ear canal and external ear normal. Tympanic membrane is erythematous.     Nose: Congestion present.     Mouth/Throat:     Pharynx: No oropharyngeal exudate.  Eyes:     Conjunctiva/sclera: Conjunctivae normal.  Cardiovascular:     Rate and Rhythm: Normal rate and regular rhythm.  Pulmonary:     Effort: Pulmonary effort is normal.     Breath sounds: Normal breath sounds.  Neurological:     General: No focal deficit present.     Mental Status: She is alert and oriented to person, place, and time.  Psychiatric:        Mood and Affect: Mood normal.        Behavior: Behavior normal.        Thought Content: Thought content normal.        Judgment: Judgment normal.     No results found for any visits on 04/26/22.       Assessment & Plan:   Problem List Items Addressed This Visit       Respiratory   Sinobronchitis - Primary    - with patient symptom severity and worsening of symptoms coupled with sinus congestion will treat with abx - recommended doxycycline but patient said she would prefer amoxicillin and is worried with her allergies if she is allergic to doxycycline - discussed if she does not feel better after the amoxicillin we may need to do doxy to get rid of symptoms - recommend mucinex for congestion      Relevant Medications   amoxicillin (AMOXIL) 875 MG tablet     Other   Flu-like symptoms    - covid tested and interpreted by myself as negative - flu tested and interpreted by myself as negative.       Meds ordered this encounter  Medications   DISCONTD: doxycycline (VIBRA-TABS) 100 MG tablet    Sig: Take 1 tablet (100 mg total) by mouth 2 (two) times daily for 7 days.    Dispense:  14 tablet    Refill:  0  amoxicillin (AMOXIL) 875 MG tablet    Sig: Take 1 tablet (875 mg total) by mouth 2 (two) times daily for 10 days.    Dispense:  20 tablet    Refill:  0    No follow-ups on file.  Charlton Amor, DO

## 2022-04-26 NOTE — Assessment & Plan Note (Addendum)
-   covid tested and interpreted by myself as negative - flu tested and interpreted by myself as negative.

## 2022-04-26 NOTE — Assessment & Plan Note (Addendum)
-   with patient symptom severity and worsening of symptoms coupled with sinus congestion will treat with abx - recommended doxycycline but patient said she would prefer amoxicillin and is worried with her allergies if she is allergic to doxycycline - discussed if she does not feel better after the amoxicillin we may need to do doxy to get rid of symptoms - recommend mucinex for congestion

## 2022-04-27 ENCOUNTER — Encounter: Payer: Self-pay | Admitting: Family Medicine

## 2022-04-29 ENCOUNTER — Other Ambulatory Visit: Payer: Self-pay | Admitting: Family Medicine

## 2022-04-29 DIAGNOSIS — J329 Chronic sinusitis, unspecified: Secondary | ICD-10-CM

## 2022-04-29 MED ORDER — METHYLPREDNISOLONE 4 MG PO TBPK: ORAL_TABLET | ORAL | 0 refills | Status: DC

## 2022-04-29 MED ORDER — DOXYCYCLINE HYCLATE 100 MG PO TABS: 100.0000 mg | ORAL_TABLET | Freq: Two times a day (BID) | ORAL | 0 refills | Status: AC

## 2022-07-05 ENCOUNTER — Encounter: Payer: Self-pay | Admitting: Family Medicine

## 2022-07-06 NOTE — Telephone Encounter (Signed)
Orders have been entered.  Auth will need to be completed.

## 2022-07-08 NOTE — Telephone Encounter (Signed)
CT study order auth approved by patient's insurance. Imaging dept was notified to schedule the patient. A MyChart message was sent to the patient regarding the approval.   Ref #[id number] Valid from 07/07/22 through 08/05/22

## 2022-07-13 ENCOUNTER — Ambulatory Visit (INDEPENDENT_AMBULATORY_CARE_PROVIDER_SITE_OTHER): Payer: BC Managed Care – PPO

## 2022-07-13 DIAGNOSIS — R911 Solitary pulmonary nodule: Secondary | ICD-10-CM

## 2022-07-14 ENCOUNTER — Ambulatory Visit: Payer: BC Managed Care – PPO

## 2022-08-16 ENCOUNTER — Ambulatory Visit: Payer: BC Managed Care – PPO | Admitting: Sports Medicine

## 2022-08-16 DIAGNOSIS — J329 Chronic sinusitis, unspecified: Secondary | ICD-10-CM

## 2022-08-16 DIAGNOSIS — J40 Bronchitis, not specified as acute or chronic: Secondary | ICD-10-CM

## 2022-08-16 LAB — POCT INFLUENZA A/B
Influenza A, POC: NEGATIVE
Influenza B, POC: NEGATIVE

## 2022-08-16 LAB — POCT RAPID STREP A (OFFICE): Rapid Strep A Screen: NEGATIVE

## 2022-08-16 MED ORDER — DOXYCYCLINE HYCLATE 100 MG PO TABS: 100.0000 mg | ORAL_TABLET | Freq: Two times a day (BID) | ORAL | 0 refills | Status: DC

## 2022-08-16 NOTE — Progress Notes (Signed)
    Procedures performed today:    None.  Independent interpretation of notes and tests performed by another provider:   None.  Brief History, Exam, Impression, and Recommendations:    Sinobronchitis Recurrence of acute maxillary sinusitis, strep and flu, COVID swabs all negative, adding doxycycline.    ____________________________________________ Gwen Her. Dianah Field, M.D., ABFM., CAQSM., AME. Primary Care and Sports Medicine [facility]  Adjunct Professor of [employer] [facility] [employer]  Risk manager

## 2022-08-16 NOTE — Addendum Note (Signed)
Addended by: Royetta Car on: 08/16/2022 04:44 PM   Modules accepted: Orders

## 2022-08-16 NOTE — Assessment & Plan Note (Signed)
Recurrence of acute maxillary sinusitis, strep and flu, COVID swabs all negative, adding doxycycline.

## 2022-08-23 ENCOUNTER — Encounter: Payer: Self-pay | Admitting: Physician Assistant

## 2022-08-23 ENCOUNTER — Ambulatory Visit: Payer: BC Managed Care – PPO | Admitting: Physician Assistant

## 2022-08-23 VITALS — BP 136/92 | HR 74 | Ht 65.0 in | Wt 202.6 lb

## 2022-08-23 DIAGNOSIS — R140 Abdominal distension (gaseous): Secondary | ICD-10-CM | POA: Diagnosis not present

## 2022-08-23 DIAGNOSIS — R1084 Generalized abdominal pain: Secondary | ICD-10-CM | POA: Insufficient documentation

## 2022-08-23 DIAGNOSIS — K5900 Constipation, unspecified: Secondary | ICD-10-CM | POA: Diagnosis not present

## 2022-08-23 MED ORDER — LINACLOTIDE 290 MCG PO CAPS: 290.0000 ug | ORAL_CAPSULE | Freq: Every day | ORAL | 0 refills | Status: DC

## 2022-08-23 NOTE — Progress Notes (Deleted)
Jan comes and goes  Bloated Worse through out day Random bowel movement Left sided abdominal pain Tight constipation  Gas X On doxy right now with no symptoms

## 2022-08-23 NOTE — Patient Instructions (Signed)
Go downstairs for labs.  Start linzess daily.  Follow up in 2 weeks

## 2022-08-23 NOTE — Progress Notes (Signed)
Acute Office Visit  Subjective:     Patient ID: Joy Rogers, female    DOB: [birth year], 32 y.o.   MRN: [id number]  Chief Complaint  Patient presents with   GI Problem    GI Problem   Patient is in today with chief complaint of bloating. This problem started in January and has been occurring intermittently since. She goes through 2-4 days of bloating, constipation, and left lower quadrant abdominal pain. Symptoms get worse after food and throughout the day. Bowel movements have been random every 2-3 days when they used to be daily previously. Does get some relief after bowel movements and GasX pills. Has tried eliminating gluten for five days with no relief. Does not eat much dairy. No other changes in diet or medications. Drinks alcohol on the weekends with occasional glass of wine with dinner during the week but has cut back on this as well. Patient has not had symptoms for the last week and is currently on Doxycycline for a sinus infection. No history of GI issues and has never experienced these symptoms before.  No dyschezia, nausea, vomiting, or diarrhea. No urinary symptoms.    .. Active Ambulatory Problems    Diagnosis Date Noted   Migraine with aura 06/24/2021   Generalized anxiety disorder 06/24/2021   Other seasonal allergic rhinitis 06/24/2021   Anxiety 06/24/2021   Amenorrhea 06/24/2021   Other fatigue 06/24/2021   Right lumbar radiculitis 06/24/2021   Pulmonary nodule 01/25/2022   Generalized abdominal pain 08/23/2022   Constipation 08/23/2022   Bloating 08/23/2022   Resolved Ambulatory Problems    Diagnosis Date Noted   Left upper quadrant abdominal pain 01/25/2022   Flu-like symptoms 04/26/2022   Sinobronchitis 04/26/2022   Past Medical History:  Diagnosis Date   Asthma    Ovarian cyst      ROS See HPI      Objective:    BP (!) 136/92 (BP Location: Left Arm, Patient Position: Sitting, Cuff Size: Normal)   Pulse 74   Ht '5\' 5"'$  (1.651 m)   Wt 202  lb 9.6 oz (91.9 kg)   SpO2 100%   BMI 33.71 kg/m  BP Readings from Last 3 Encounters:  08/23/22 (!) 136/92  08/16/22 123/87  04/26/22 124/77   Wt Readings from Last 3 Encounters:  08/23/22 202 lb 9.6 oz (91.9 kg)  04/26/22 202 lb (91.6 kg)  01/25/22 198 lb (89.8 kg)      Physical Exam Constitutional:      Appearance: Normal appearance.  Abdominal:     General: Abdomen is flat. Bowel sounds are normal. There is no distension.     Palpations: Abdomen is soft. There is no mass.     Comments: Tenderness in lower abdominal quadrants   Musculoskeletal:        General: Normal range of motion.  Neurological:     Mental Status: She is oriented to person, place, and time.  Psychiatric:        Mood and Affect: Mood normal.         Assessment & Plan:   Joy Rogers was seen today for gi problem.  Diagnoses and all orders for this visit:  Generalized abdominal pain -     CBC w/Diff/Platelet -     Comprehensive metabolic panel -     TSH -     Food Allergy Profile -     Celiac panel 10([facility]) -     US Abdomen Complete; Future -  US Pelvic Complete With Transvaginal; Future  Bloating -     CBC w/Diff/Platelet -     Comprehensive metabolic panel -     TSH -     Food Allergy Profile -     Celiac panel 10([facility]) -     US Abdomen Complete; Future -     US Pelvic Complete With Transvaginal; Future  Constipation, unspecified constipation type -     CBC w/Diff/Platelet -     Comprehensive metabolic panel -     TSH -     Food Allergy Profile -     Celiac panel 10([facility]) -     linaclotide (LINZESS) 290 MCG CAPS capsule; Take 1 capsule (290 mcg total) by mouth daily.   Start Linzess daily for 2 weeks for constipation  Ordered abdominal/pelvic U/S due to symptoms.  Ordered lab work with celiac and food allergy testing   Return in about 2 weeks (around 09/06/2022).  Iran Planas, PA-C

## 2022-08-24 NOTE — Progress Notes (Signed)
Kynedi,   Kidney, liver, glucose looks good.  Thyroid looks great.  Celiac still pending.  WBC and hemoglobin look good.  Some labs still pending.

## 2022-08-26 ENCOUNTER — Encounter: Payer: Self-pay | Admitting: Physician Assistant

## 2022-08-26 DIAGNOSIS — Z91018 Allergy to other foods: Secondary | ICD-10-CM | POA: Insufficient documentation

## 2022-08-26 LAB — COMPREHENSIVE METABOLIC PANEL
ALT: 19 IU/L (ref 0–32)
AST: 21 IU/L (ref 0–40)
Albumin/Globulin Ratio: 1.9 (ref 1.2–2.2)
Albumin: 4.5 g/dL (ref 3.9–4.9)
Alkaline Phosphatase: 53 IU/L (ref 44–121)
BUN/Creatinine Ratio: 13 (ref 9–23)
BUN: 10 mg/dL (ref 6–20)
Bilirubin Total: 1 mg/dL (ref 0.0–1.2)
CO2: 23 mmol/L (ref 20–29)
Calcium: 9.6 mg/dL (ref 8.7–10.2)
Chloride: 104 mmol/L (ref 96–106)
Creatinine, Ser: 0.77 mg/dL (ref 0.57–1.00)
Globulin, Total: 2.4 g/dL (ref 1.5–4.5)
Glucose: 87 mg/dL (ref 70–99)
Potassium: 4.5 mmol/L (ref 3.5–5.2)
Sodium: 140 mmol/L (ref 134–144)
Total Protein: 6.9 g/dL (ref 6.0–8.5)
eGFR: 104 mL/min/{1.73_m2} (ref >59)

## 2022-08-26 LAB — CBC WITH DIFFERENTIAL/PLATELET
Basophils Absolute: 0 10*3/uL (ref 0.0–0.2)
Basos: 1 %
EOS (ABSOLUTE): 0.5 10*3/uL — ABNORMAL HIGH (ref 0.0–0.4)
Eos: 10 %
Hematocrit: 39.6 % (ref 34.0–46.6)
Hemoglobin: 13.7 g/dL (ref 11.1–15.9)
Immature Grans (Abs): 0 10*3/uL (ref 0.0–0.1)
Immature Granulocytes: 0 %
Lymphocytes Absolute: 2.2 10*3/uL (ref 0.7–3.1)
Lymphs: 44 %
MCH: 32.9 pg (ref 26.6–33.0)
MCHC: 34.6 g/dL (ref 31.5–35.7)
MCV: 95 fL (ref 79–97)
Monocytes Absolute: 0.4 10*3/uL (ref 0.1–0.9)
Monocytes: 7 %
Neutrophils Absolute: 1.9 10*3/uL (ref 1.4–7.0)
Neutrophils: 38 %
Platelets: 307 10*3/uL (ref 150–450)
RBC: 4.17 x10E6/uL (ref 3.77–5.28)
RDW: 11.9 % (ref 11.7–15.4)
WBC: 5 10*3/uL (ref 3.4–10.8)

## 2022-08-26 LAB — TSH: TSH: 1.33 u[IU]/mL (ref 0.450–4.500)

## 2022-08-26 LAB — FOOD ALLERGY PROFILE
Allergen Corn, IgE: 0.37 kU/L — AB
Clam IgE: <0.1 kU/L
Codfish IgE: <0.1 kU/L
Egg White IgE: 0.12 kU/L — AB
Milk IgE: <0.1 kU/L
Peanut IgE: <0.1 kU/L
Scallop IgE: <0.1 kU/L
Sesame Seed IgE: <0.1 kU/L
Shrimp IgE: <0.1 kU/L
Soybean IgE: <0.1 kU/L
Walnut IgE: <0.1 kU/L
Wheat IgE: <0.1 kU/L

## 2022-08-26 LAB — CELIAC PANEL 10
Antigliadin Abs, IgA: 3 units (ref 0–19)
Endomysial IgA: NEGATIVE
Gliadin IgG: 3 units (ref 0–19)
IgA/Immunoglobulin A, Serum: 203 mg/dL (ref 87–352)
Tissue Transglut Ab: <2 U/mL (ref 0–5)
Transglutaminase IgA: <2 U/mL (ref 0–3)

## 2022-08-26 NOTE — Progress Notes (Signed)
Negative celiac.   IGE response to egg white and corn. This should be eliminated. If not feeling better we should consider more extensive food allergy testing with allergy.

## 2022-08-29 ENCOUNTER — Ambulatory Visit (INDEPENDENT_AMBULATORY_CARE_PROVIDER_SITE_OTHER): Payer: BC Managed Care – PPO

## 2022-08-29 DIAGNOSIS — R140 Abdominal distension (gaseous): Secondary | ICD-10-CM | POA: Diagnosis not present

## 2022-08-29 DIAGNOSIS — R1084 Generalized abdominal pain: Secondary | ICD-10-CM

## 2022-08-29 NOTE — Progress Notes (Signed)
Normal ultrasound. No acute findings.

## 2022-09-02 ENCOUNTER — Ambulatory Visit (INDEPENDENT_AMBULATORY_CARE_PROVIDER_SITE_OTHER): Payer: BC Managed Care – PPO

## 2022-09-02 DIAGNOSIS — R1084 Generalized abdominal pain: Secondary | ICD-10-CM

## 2022-09-02 DIAGNOSIS — R140 Abdominal distension (gaseous): Secondary | ICD-10-CM

## 2022-09-05 NOTE — Progress Notes (Signed)
Hi Joy Rogers, no acute findings on the ultrasound which is reassuring.  Neuroid's or mass etc.  Please let us know how you are feeling.

## 2022-09-07 ENCOUNTER — Ambulatory Visit: Payer: BC Managed Care – PPO | Admitting: Physician Assistant

## 2022-09-07 ENCOUNTER — Encounter: Payer: Self-pay | Admitting: Physician Assistant

## 2022-09-07 VITALS — BP 122/83 | HR 73 | Ht 65.0 in | Wt 208.0 lb

## 2022-09-07 DIAGNOSIS — Z91018 Allergy to other foods: Secondary | ICD-10-CM | POA: Diagnosis not present

## 2022-09-07 DIAGNOSIS — K5900 Constipation, unspecified: Secondary | ICD-10-CM

## 2022-09-07 DIAGNOSIS — R1084 Generalized abdominal pain: Secondary | ICD-10-CM

## 2022-09-07 NOTE — Progress Notes (Signed)
Established Patient Office Visit  Subjective   Patient ID: Joy Rogers, female    DOB: 1990/07/10  Age: 32 y.o. MRN: [id number]  Chief Complaint  Patient presents with   Abdominal Pain    HPI Pt is a 32 yo female who presents to the clinic to follow up on generalized abdominal pain and constipation. She had food allergy testing and celiac testing. Celiac testing was negative. [place] of abdomen and pelvis were normal.   Her food alllergy testing below.       Component Ref Range & Units 2 wk ago  Class Description Allergens  Comment  Comment:     Levels of Specific IgE       Class  Description of Class     ---------------------------  -----  --------------------                    < 0.10         0         Negative            0.10 -    0.31         0/I       Equivocal/Low            0.32 -    0.55         I         Low            0.56 -    1.40         II        Moderate            1.41 -    3.90         III       High            3.91 -   19.00         IV        Very High           19.01 -  100.00         V         Very High                   >100.00         VI        Very High  Egg White IgE Class 0/I kU/L 0.12 Abnormal   Peanut IgE Class 0 kU/L <0.10  Soybean IgE Class 0 kU/L <0.10  Milk IgE Class 0 kU/L <0.10  Clam IgE Class 0 kU/L <0.10  Shrimp IgE Class 0 kU/L <0.10  Walnut IgE Class 0 kU/L <0.10  Codfish IgE Class 0 kU/L <0.10  Scallop IgE Class 0 kU/L <0.10  Wheat IgE Class 0 kU/L <0.10  Allergen Corn, IgE Class I kU/L 0.37 Abnormal   Sesame Seed IgE Class 0 kU/L <0.10  Resulting Agency  [facility]            She has eliminated egg white and corn from diet and feels much better. Abdominal pain has resolved. She is having regular full bowel movements with no problem. She never got linzess filled.   Patient Active Problem List   Diagnosis Date Noted   Food allergy 08/26/2022   Generalized abdominal pain 08/23/2022   Constipation 08/23/2022   Bloating 08/23/2022    Pulmonary nodule 01/25/2022   Migraine with aura 06/24/2021   Generalized anxiety disorder  06/24/2021   Other seasonal allergic rhinitis 06/24/2021   Anxiety 06/24/2021   Amenorrhea 06/24/2021   Other fatigue 06/24/2021   Right lumbar radiculitis 06/24/2021   Past Medical History:  Diagnosis Date   Anxiety    Asthma    Ovarian cyst    Family History  Problem Relation Age of Onset   Hypertension Mother    Lung cancer Father    Breast cancer Maternal Grandmother    Breast cancer Maternal Aunt    Lung cancer Paternal Uncle    Allergies  Allergen Reactions   Morphine Sulfate Other (See Comments)    Causes anaphylaxis in mom   Cephalexin Hives and Nausea Only   Clindamycin/Lincomycin Hives, Rash and Other (See Comments)    Abd Pain   Latex Itching and Rash      Review of Systems  All other systems reviewed and are negative.     Objective:     BP 122/83   Pulse 73   Ht '5\' 5"'$  (1.651 m)   Wt 208 lb (94.3 kg)   SpO2 100%   BMI 34.61 kg/m  BP Readings from Last 3 Encounters:  09/07/22 122/83  08/23/22 (!) 136/92  08/16/22 123/87   Wt Readings from Last 3 Encounters:  09/07/22 208 lb (94.3 kg)  08/23/22 202 lb 9.6 oz (91.9 kg)  04/26/22 202 lb (91.6 kg)      Physical Exam Constitutional:      Appearance: She is well-developed.  HENT:     Head: Normocephalic.  Cardiovascular:     Rate and Rhythm: Normal rate.  Pulmonary:     Effort: Pulmonary effort is normal.  Abdominal:     General: There is no distension.     Tenderness: There is no abdominal tenderness.  Neurological:     General: No focal deficit present.     Mental Status: She is alert.          Assessment & Plan:  Marland KitchenMarland KitchenMellissa was seen today for abdominal pain.  Diagnoses and all orders for this visit:  Food allergy  Generalized abdominal pain  Constipation, unspecified constipation type   After eliminating foods her abdominal pain and constipation have resolved. She is feeling  much better.  Discussed probiotic and to continue food elimination.  Follow up as needed or if symptoms persist.    Iran Planas, PA-C

## 2022-12-03 IMAGING — DX DG LUMBAR SPINE COMPLETE 4+V
5 series · 5 of 5 positions shown · non-contrast
Comparison: November 10, 2021

CLINICAL DATA: History of lumbar discectomy and laminectomy in Monday October, 2021 with persistent lower extremity discomfort.

EXAM:
LUMBAR SPINE - COMPLETE 4+ VIEW

[l-spine ap]
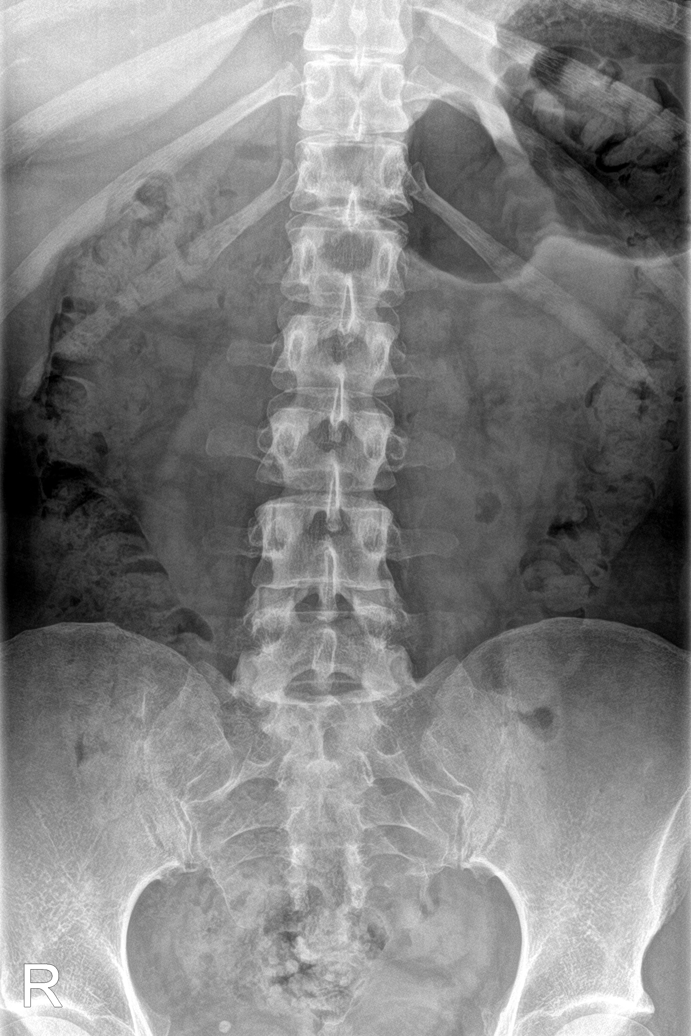

[l-spine obl (1 of 2)]
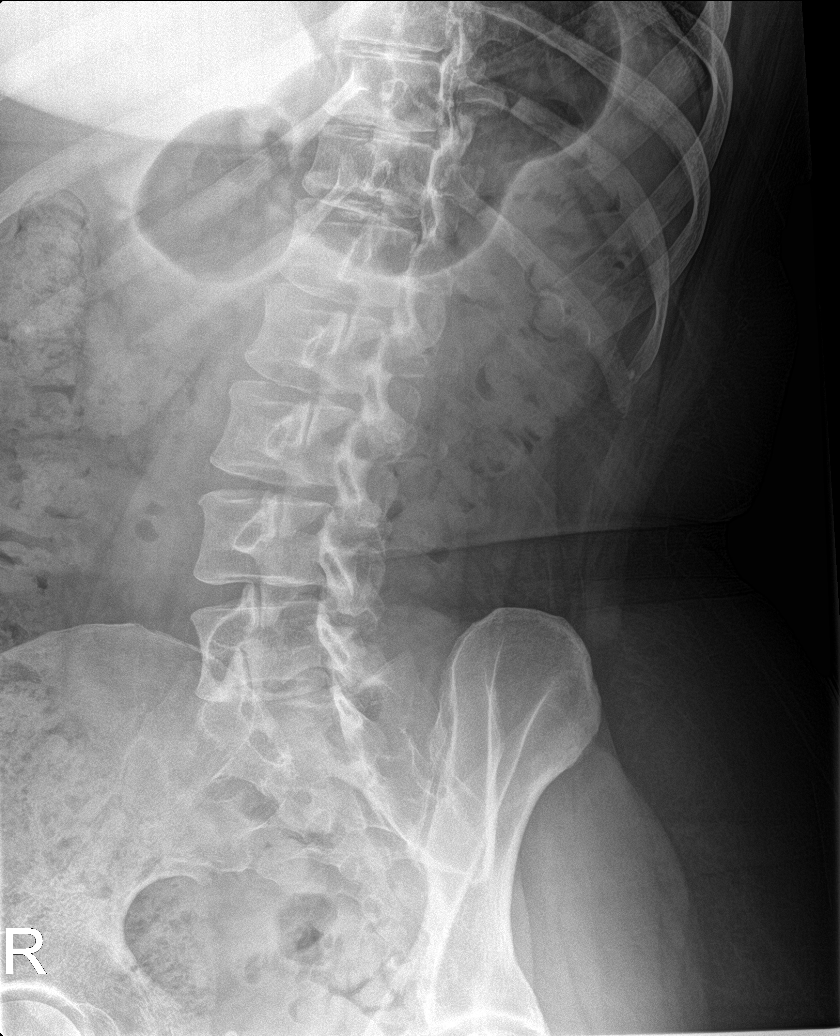

[l-spine obl (2 of 2)]
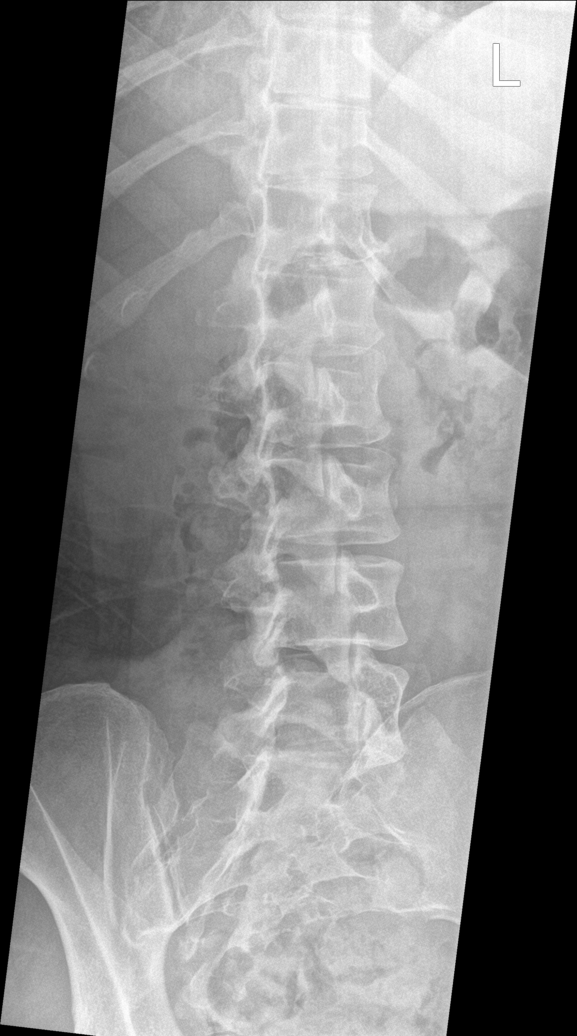

[l-spine lat]
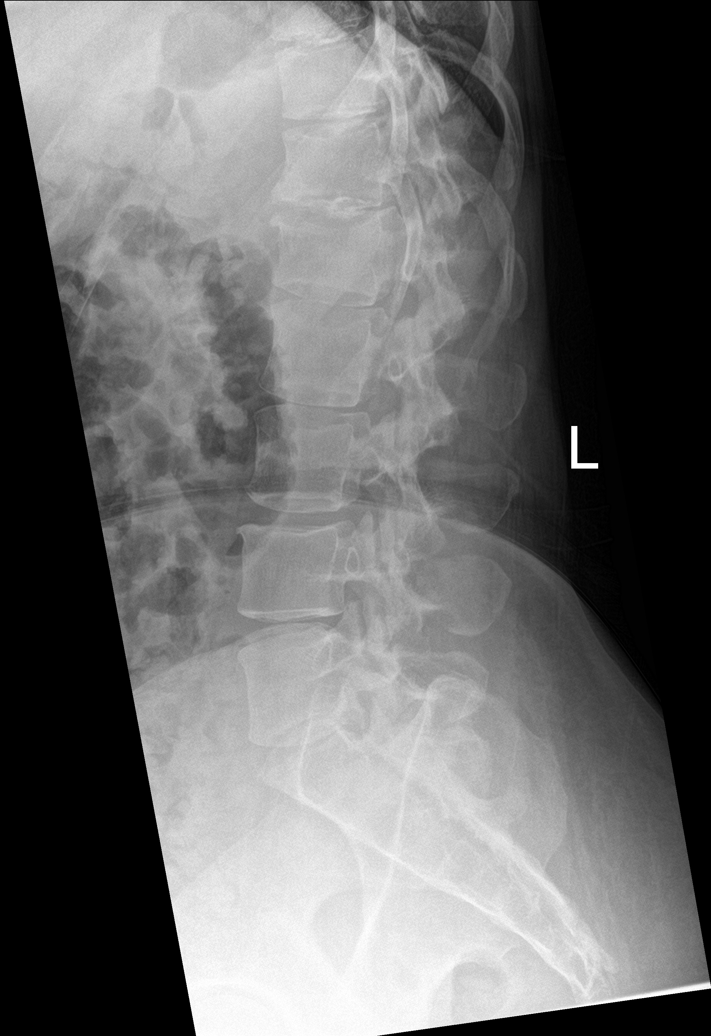

[l-spine spot]
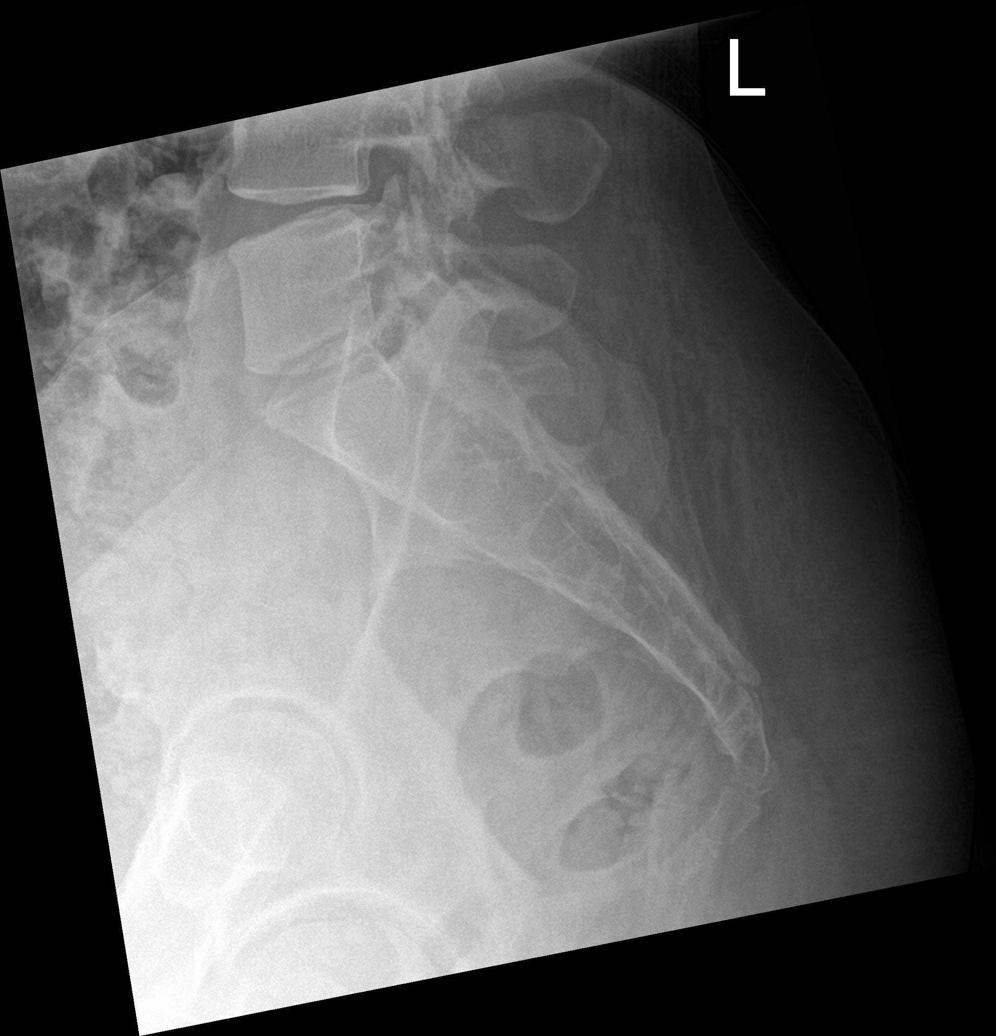

[5 of 5 positions shown; findings below may reference images not displayed]

FINDINGS: There is no evidence of lumbar spine fracture. Alignment is normal.
Marked severity endplate sclerosis and moderate severity anterior
osteophyte formation are seen at the level of L1-L2. Moderate
severity intervertebral disc space narrowing is also seen at this
level.
IMPRESSION: Marked severity degenerative disc disease at L1-L2.

## 2022-12-05 ENCOUNTER — Ambulatory Visit: Payer: BC Managed Care – PPO | Admitting: Family Medicine

## 2022-12-05 ENCOUNTER — Encounter: Payer: Self-pay | Admitting: Family Medicine

## 2022-12-05 VITALS — BP 108/70 | HR 79 | Ht 65.0 in | Wt 213.0 lb

## 2022-12-05 DIAGNOSIS — E559 Vitamin D deficiency, unspecified: Secondary | ICD-10-CM

## 2022-12-05 DIAGNOSIS — F411 Generalized anxiety disorder: Secondary | ICD-10-CM | POA: Diagnosis not present

## 2022-12-05 NOTE — Assessment & Plan Note (Signed)
Felt better with vitamin d supplementation in the past.  Suggested magnesium glycinate supplement to help with anxiety and sleep as well.  Will complete letter for her ESA as well.

## 2022-12-05 NOTE — Patient Instructions (Addendum)
Try magnesium glycinate 250-500mg  daily to help with sleep and anxiety.   Attach other letter to a [employer].

## 2022-12-05 NOTE — Progress Notes (Signed)
Joy Rogers - 32 y.o. female MRN [id number]  Date of birth: Nov 20, 1990  Subjective Chief Complaint  Patient presents with   Emotional Support Letter   Anxiety    HPI Joy Rogers is a 32 y.o. female here today for follow up visit.   She has and ESA for anxiety and has letter for this in the past, however she reports that she needs an updated letter for her apartment complex. Her ESA is cat.  She is still in the same apartment complex but moving to a different apartment.  She does report some increased anxiety and sleep difficulty over the past few weeks.  She felt like this when her vitamin d was low and would like to have this checked.   ROS:  A comprehensive ROS was completed and negative except as noted per HPI  Allergies  Allergen Reactions   Morphine Sulfate Other (See Comments)    Causes anaphylaxis in mom   Cephalexin Hives and Nausea Only   Clindamycin/Lincomycin Hives, Rash and Other (See Comments)    Abd Pain   Latex Itching and Rash    Past Medical History:  Diagnosis Date   Anxiety    Asthma    Ovarian cyst     Past Surgical History:  Procedure Laterality Date   LAMINECTOMY N/A 11/10/2021   Partial Disectomy on L5 & S1   TONSILLECTOMY     WISDOM TOOTH EXTRACTION      Social History   Socioeconomic History   Marital status: Single    Spouse name: Not on file   Number of children: Not on file   Years of education: Not on file   Highest education level: Bachelor's degree (e.g., BA, AB, BS)  Occupational History   Not on file  Tobacco Use   Smoking status: Never    Passive exposure: Never   Smokeless tobacco: Never  Vaping Use   Vaping Use: Every day   Substances: Nicotine  Substance and Sexual Activity   Alcohol use: Yes    Alcohol/week: 10.0 standard drinks of alcohol    Types: 10 Standard drinks or equivalent per week    Comment: Weekends   Drug use: Never   Sexual activity: Yes    Partners: Female, Female    Birth control/protection:  Condom  Other Topics Concern   Not on file  Social History Narrative   Not on file   Social Determinants of Health   Financial Resource Strain: Medium Risk (12/03/2022)   Overall Financial Resource Strain (CARDIA)    Difficulty of Paying Living Expenses: Somewhat hard  Food Insecurity: No Food Insecurity (12/03/2022)   Hunger Vital Sign    Worried About Running Out of Food in the Last Year: Never true    Ran Out of Food in the Last Year: Never true  Transportation Needs: No Transportation Needs (12/03/2022)   PRAPARE - Administrator, Civil Service (Medical): No    Lack of Transportation (Non-Medical): No  Physical Activity: Insufficiently Active (12/03/2022)   Exercise Vital Sign    Days of Exercise per Week: 3 days    Minutes of Exercise per Session: 30 min  Stress: Stress Concern Present (12/03/2022)   [employer] of Occupational Health - Occupational Stress Questionnaire    Feeling of Stress : Rather much  Social Connections: Moderately Isolated (12/03/2022)   Social Connection and Isolation Panel [NHANES]    Frequency of Communication with Friends and Family: Three times a week    Frequency of  Social Gatherings with Friends and Family: Twice a week    Attends Religious Services: Never    Database administrator or Organizations: No    Attends Engineer, structural: Not on file    Marital Status: Living with partner    Family History  Problem Relation Age of Onset   Hypertension Mother    Lung cancer Father    Breast cancer Maternal Grandmother    Breast cancer Maternal Aunt    Lung cancer Paternal Uncle     Health Maintenance  Topic Date Due   Hepatitis C Screening  01/26/2023 (Originally 03/03/2007)   HIV Screening  01/26/2023 (Originally 03/02/2004)   PAP SMEAR-Modifier  03/07/2023 (Originally 11/11/2017)   COVID-19 Vaccine (3 - 2023-24 season) 04/22/2023 (Originally 02/25/2022)   INFLUENZA VACCINE  01/26/2023   DTaP/Tdap/Td (2 - Tdap) 07/29/2026   HPV  VACCINES  Aged Out     ----------------------------------------------------------------------------------------------------------------------------------------------------------------------------------------------------------------- Physical Exam BP 108/70 (BP Location: Left Arm, Patient Position: Sitting, Cuff Size: Large)   Pulse 79   Ht 5\' 5"  (1.651 m)   Wt 213 lb (96.6 kg)   SpO2 99%   BMI 35.45 kg/m   Physical Exam Constitutional:      Appearance: Normal appearance.  Eyes:     General: No scleral icterus. Neurological:     Mental Status: She is alert.  Psychiatric:        Mood and Affect: Mood normal.        Behavior: Behavior normal.     ------------------------------------------------------------------------------------------------------------------------------------------------------------------------------------------------------------------- Assessment and Plan  Generalized anxiety disorder Felt better with vitamin d supplementation in the past.  Suggested magnesium glycinate supplement to help with anxiety and sleep as well.  Will complete letter for her ESA as well.    No orders of the defined types were placed in this encounter.   No follow-ups on file.    This visit occurred during the SARS-CoV-2 public health emergency.  Safety protocols were in place, including screening questions prior to the visit, additional usage of staff PPE, and extensive cleaning of exam room while observing appropriate contact time as indicated for disinfecting solutions.

## 2022-12-06 LAB — VITAMIN D 25 HYDROXY (VIT D DEFICIENCY, FRACTURES): Vit D, 25-Hydroxy: 32 ng/mL (ref 30–100)

## 2022-12-20 ENCOUNTER — Encounter: Payer: Self-pay | Admitting: Family Medicine

## 2023-02-14 ENCOUNTER — Encounter: Payer: Self-pay | Admitting: Family Medicine

## 2023-02-14 MED ORDER — ALBUTEROL SULFATE HFA 108 (90 BASE) MCG/ACT IN AERS: 1.0000 | INHALATION_SPRAY | Freq: Four times a day (QID) | RESPIRATORY_TRACT | 2 refills | Status: DC | PRN

## 2023-05-16 ENCOUNTER — Encounter: Payer: Self-pay | Admitting: Family Medicine

## 2023-05-16 ENCOUNTER — Ambulatory Visit: Payer: BC Managed Care – PPO | Admitting: Family Medicine

## 2023-05-16 VITALS — BP 125/82 | HR 74 | Ht 65.0 in | Wt 216.2 lb

## 2023-05-16 DIAGNOSIS — J019 Acute sinusitis, unspecified: Secondary | ICD-10-CM

## 2023-05-16 DIAGNOSIS — B379 Candidiasis, unspecified: Secondary | ICD-10-CM | POA: Diagnosis not present

## 2023-05-16 DIAGNOSIS — T3695XA Adverse effect of unspecified systemic antibiotic, initial encounter: Secondary | ICD-10-CM

## 2023-05-16 DIAGNOSIS — R52 Pain, unspecified: Secondary | ICD-10-CM | POA: Diagnosis not present

## 2023-05-16 DIAGNOSIS — B9689 Other specified bacterial agents as the cause of diseases classified elsewhere: Secondary | ICD-10-CM | POA: Insufficient documentation

## 2023-05-16 DIAGNOSIS — J029 Acute pharyngitis, unspecified: Secondary | ICD-10-CM | POA: Diagnosis not present

## 2023-05-16 LAB — POCT RAPID STREP A (OFFICE): Rapid Strep A Screen: NEGATIVE

## 2023-05-16 LAB — POCT INFLUENZA A/B
Influenza A, POC: NEGATIVE
Influenza B, POC: NEGATIVE

## 2023-05-16 LAB — POC COVID19 BINAXNOW: SARS Coronavirus 2 Ag: NEGATIVE

## 2023-05-16 MED ORDER — DOXYCYCLINE HYCLATE 100 MG PO TABS: 100.0000 mg | ORAL_TABLET | Freq: Two times a day (BID) | ORAL | 0 refills | Status: AC

## 2023-05-16 MED ORDER — FLUCONAZOLE 150 MG PO TABS: 150.0000 mg | ORAL_TABLET | Freq: Once | ORAL | 0 refills | Status: AC

## 2023-05-16 NOTE — Progress Notes (Signed)
Acute Office Visit  Subjective:     Patient ID: Joy Rogers, female    DOB: 1991-01-25, 32 y.o.   MRN: [id number]  Chief Complaint  Patient presents with   Nasal Congestion    Sneezing out blood, nasal drip    HPI Patient is in today for sneezing blood for three days. Also admits to some congestion associated with headaches. Does have body aches. 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   Review of Systems  Constitutional:  Negative for chills and fever.  HENT:  Positive for congestion.   Respiratory:  Negative for cough and shortness of breath.   Cardiovascular:  Negative for chest pain.  Neurological:  Negative for headaches.        Objective:    BP 125/82 (BP Location: Left Arm, Patient Position: Sitting, Cuff Size: Large)   Pulse 74   Ht 5\' 5"  (1.651 m)   Wt 216 lb 4 oz (98.1 kg)   SpO2 99%   BMI 35.99 kg/m    Physical Exam Vitals and nursing note reviewed.  Constitutional:      General: She is not in acute distress.    Appearance: Normal appearance.  HENT:     Head: Normocephalic and atraumatic.     Comments: Tenderness to palpation of frontal and maxillary sinuses bilaterally    Right Ear: External ear normal.     Left Ear: Tympanic membrane, ear canal and external ear normal.     Ears:     Comments: Some fluid seen in right ear    Nose:     Comments: Erythema of nares bilaterally    Mouth/Throat:     Pharynx: Posterior oropharyngeal erythema present.  Eyes:     Conjunctiva/sclera: Conjunctivae normal.  Cardiovascular:     Rate and Rhythm: Normal rate and regular rhythm.  Pulmonary:     Effort: Pulmonary effort is normal.     Breath sounds: Normal breath sounds.  Neurological:     General: No focal deficit present.     Mental Status: She is alert and oriented to person, place, and time.  Psychiatric:        Mood and Affect: Mood normal.        Behavior: Behavior normal.        Thought Content: Thought content normal.         Judgment: Judgment normal.     Results for orders placed or performed in visit on 05/16/23  POCT Influenza A/B  Result Value Ref Range   Influenza A, POC Negative Negative   Influenza B, POC Negative Negative  POCT rapid strep A  Result Value Ref Range   Rapid Strep A Screen Negative Negative  POC COVID-19  Result Value Ref Range   SARS Coronavirus 2 Ag Negative Negative        Assessment & Plan:   Problem List Items Addressed This Visit       Respiratory   Acute bacterial sinusitis - Primary    - Poc flu and covid negative - based on headaches and sinus pressure will go ahead and treat with doxycycline. Recommend continue normal saline and flonase prn in addition to zyrtec. Discussed that if bleeding occurs from nose after this we can consider ent referral as likely the vessels in her nares are fragile and will easily bleed with nose blowing.       Relevant Medications   doxycycline (VIBRA-TABS) 100 MG tablet   fluconazole (DIFLUCAN) 150 MG  tablet   Other Visit Diagnoses     Sorethroat       Relevant Orders   POCT rapid strep A (Completed)   Antibiotic-induced yeast infection       Relevant Medications   fluconazole (DIFLUCAN) 150 MG tablet   Body aches       Relevant Orders   POCT Influenza A/B (Completed)   POC COVID-19 (Completed)       Meds ordered this encounter  Medications   doxycycline (VIBRA-TABS) 100 MG tablet    Sig: Take 1 tablet (100 mg total) by mouth 2 (two) times daily for 7 days.    Dispense:  14 tablet    Refill:  0   fluconazole (DIFLUCAN) 150 MG tablet    Sig: Take 1 tablet (150 mg total) by mouth once for 1 dose. If no better in 72 hours take second dose.    Dispense:  2 tablet    Refill:  0    No follow-ups on file.  Charlton Amor, DO

## 2023-05-16 NOTE — Assessment & Plan Note (Signed)
-   Poc flu and covid negative - based on headaches and sinus pressure will go ahead and treat with doxycycline. Recommend continue normal saline and flonase prn in addition to zyrtec. Discussed that if bleeding occurs from nose after this we can consider ent referral as likely the vessels in her nares are fragile and will easily bleed with nose blowing.

## 2023-07-18 ENCOUNTER — Ambulatory Visit (INDEPENDENT_AMBULATORY_CARE_PROVIDER_SITE_OTHER): Payer: 59 | Admitting: Medical-Surgical

## 2023-07-18 ENCOUNTER — Encounter: Payer: Self-pay | Admitting: Medical-Surgical

## 2023-07-18 ENCOUNTER — Ambulatory Visit: Payer: Self-pay | Admitting: Family Medicine

## 2023-07-18 VITALS — BP 132/91 | HR 90 | Ht 65.0 in | Wt 226.0 lb

## 2023-07-18 DIAGNOSIS — R0602 Shortness of breath: Secondary | ICD-10-CM

## 2023-07-18 NOTE — Telephone Encounter (Signed)
Copied from CRM [id number]. Topic: Clinical - Red Word Triage >> Jul 18, 2023  8:06 AM Nila Nephew wrote: Red Word that prompted transfer to Nurse Triage: Shortness of Breath coming and going, unsure if due to weather, found glass nodule in lung about a year ago  Chief Complaint: SOB at times but none now Symptoms: Breathing feels Dry & raw, chest tight at times, SOB at times Frequency: 3-4 weeks  Pertinent Negatives: Patient denies wheezing at present Disposition: [] ED /[] Urgent Care (no appt availability in office) / [x] Appointment(In office/virtual)/ []  [facility]/ [] Home Care/ [] Refused Recommended Disposition /[] [facility]/ []  Follow-up with PCP Additional Notes: - using inhaler seems to help for a little while - using inhaler more.  Using humidifier  Reason for Disposition  [1] MILD difficulty breathing (e.g., minimal/no SOB at rest, SOB with walking, pulse <100) AND [2] NEW-onset or WORSE than normal    Nurse recommended see PCP today: pt requested to come in at 4pm - appointment scheduled. Note: patient not actively having SOB or chest tightness at present time.  Answer Assessment - Initial Assessment Questions 1. RESPIRATORY STATUS: "Describe your breathing?" (e.g., wheezing, shortness of breath, unable to speak, severe coughing)      SOB at times, wheezing x1 but not now 2. ONSET: "When did this breathing problem begin?"      X 3 to 4 weeks 3. PATTERN "Does the difficult breathing come and go, or has it been constant since it started?"      Comes and goes 4. SEVERITY: "How bad is your breathing?" (e.g., mild, moderate, severe)    - MILD: No SOB at rest, mild SOB with walking, speaks normally in sentences, can lie down, no retractions, pulse < 100.    - MODERATE: SOB at rest, SOB with minimal exertion and prefers to sit, cannot lie down flat, speaks in phrases, mild retractions, audible wheezing, pulse 100-120.    - SEVERE: Very SOB at rest, speaks in single  words, struggling to breathe, sitting hunched forward, retractions, pulse > 120      Mild  5. RECURRENT SYMPTOM: "Have you had difficulty breathing before?" If Yes, ask: "When was the last time?" and "What happened that time?"      Yes but not this long 6. CARDIAC HISTORY: "Do you have any history of heart disease?" (e.g., heart attack, angina, bypass surgery, angioplasty)      no 7. LUNG HISTORY: "Do you have any history of lung disease?"  (e.g., pulmonary embolus, asthma, emphysema)     Allergy trigger asthma 8. CAUSE: "What do you think is causing the breathing problem?"      unknown 9. OTHER SYMPTOMS: "Do you have any other symptoms? (e.g., dizziness, runny nose, cough, chest pain, fever)     Breathing feels Dry & raw, chest tight at times, SOB at times - using inhaler seems to help for a little while - using inhaler more.  Using humidifier 10. O2 SATURATION MONITOR:  "Do you use an oxygen saturation monitor (pulse oximeter) at home?" If Yes, ask: "What is your reading (oxygen level) today?" "What is your usual oxygen saturation reading?" (e.g., 95%)       N/a 11. PREGNANCY: "Is there any chance you are pregnant?" "When was your last menstrual period?"       no 12. TRAVEL: "Have you traveled out of the country in the last month?" (e.g., travel history, exposures)       N/a  Protocols used: Breathing  Difficulty-A-AH

## 2023-07-18 NOTE — Progress Notes (Signed)
        Established patient visit  History, exam, impression, and plan:  1. Shortness of breath (Primary) Very pleasant 33 year old female presenting today with a history of allergy induced asthma.  Approximate 3 weeks ago, she noted that she had been outside for a while but when she went and she started wheezing.  She used her albuterol inhaler which was helpful.  After that, she got a humidifier that she is using around home and finds that helpful as well.  She has a friend who is a nurse who came to listen to her lungs and reported that she did not have any abnormal lung sounds.  Since then she has had no further wheezing however there are times when she feels like she cannot get a deep breath regardless of her activity.  She does have allergies and tends to practice stringent home care with regular vacuuming.  Admits to an anxiety history and her current symptoms make her feel stressed with worry that there may be something wrong.  This therefore increases chest pain and shortness of breath in a vicious cycle.  She has been using Flonase and saline spray but notes that she gets residual blood when she sneezes.  No active nosebleeds at this time.  She has had had some minor sniffles but no other upper respiratory symptoms.  On exam today, lungs are clear to auscultation throughout.  Respirations are even and unlabored.  HRRR, S1/S2 normal.  Afebrile.  Not ill-appearing.  Discussed various options including a chest x-ray and the addition of a steroid inhaler or Airsupra.  She is hesitant to consider adding a new medication at this time and would like to stick with her albuterol inhaler as needed.  I reiterated that this should not be used more than 2 puffs every 4 hours as needed. Declined chest x-ray today. Patient plans to monitor symptoms and if still an issue in a couple of weeks or if her symptoms worsen, she will let let us know.   Procedures performed this visit: None.  Return if symptoms worsen  or fail to improve.  __________________________________ Thayer Ohm, DNP, APRN, FNP-BC Primary Care and Sports Medicine [facility] [place]

## 2023-08-07 ENCOUNTER — Ambulatory Visit (INDEPENDENT_AMBULATORY_CARE_PROVIDER_SITE_OTHER): Payer: 59 | Admitting: Family Medicine

## 2023-08-07 ENCOUNTER — Encounter: Payer: Self-pay | Admitting: Family Medicine

## 2023-08-07 VITALS — BP 127/84 | HR 75 | Ht 65.0 in | Wt 223.0 lb

## 2023-08-07 DIAGNOSIS — R52 Pain, unspecified: Secondary | ICD-10-CM | POA: Diagnosis not present

## 2023-08-07 DIAGNOSIS — J069 Acute upper respiratory infection, unspecified: Secondary | ICD-10-CM | POA: Diagnosis not present

## 2023-08-07 DIAGNOSIS — R051 Acute cough: Secondary | ICD-10-CM | POA: Diagnosis not present

## 2023-08-07 LAB — POCT INFLUENZA A/B
Influenza A, POC: NEGATIVE
Influenza B, POC: NEGATIVE

## 2023-08-07 LAB — POC COVID19 BINAXNOW: SARS Coronavirus 2 Ag: NEGATIVE

## 2023-08-07 MED ORDER — HYDROCOD POLI-CHLORPHE POLI ER 10-8 MG/5ML PO SUER: 5.0000 mL | Freq: Two times a day (BID) | ORAL | 0 refills | Status: DC | PRN

## 2023-08-07 NOTE — Progress Notes (Signed)
 Acute Office Visit  Subjective:     Patient ID: Joy Rogers, female    DOB: 1990-11-05, 33 y.o.   MRN: [id number]  Chief Complaint  Patient presents with   Sinusitis    HPI Patient is in today for concerns of fever, chills, and headache that started early this morning around 4am.   Review of Systems  Constitutional:  Positive for chills, fever and malaise/fatigue.  Respiratory:  Negative for cough.   Neurological:  Positive for headaches.        Objective:    BP 127/84 (BP Location: Left Arm, Patient Position: Sitting, Cuff Size: Large)   Pulse 75   Ht 5\' 5"  (1.651 m)   Wt 223 lb (101.2 kg)   SpO2 100%   BMI 37.11 kg/m    Physical Exam Vitals and nursing note reviewed.  Constitutional:      General: She is not in acute distress.    Appearance: Normal appearance.  HENT:     Head: Normocephalic and atraumatic.     Right Ear: External ear normal.     Left Ear: External ear normal.     Nose: Nose normal.  Eyes:     Conjunctiva/sclera: Conjunctivae normal.  Cardiovascular:     Rate and Rhythm: Normal rate and regular rhythm.  Pulmonary:     Effort: Pulmonary effort is normal.     Breath sounds: Normal breath sounds.  Neurological:     General: No focal deficit present.     Mental Status: She is alert and oriented to person, place, and time.  Psychiatric:        Mood and Affect: Mood normal.        Behavior: Behavior normal.        Thought Content: Thought content normal.        Judgment: Judgment normal.     Results for orders placed or performed in visit on 08/07/23  POC COVID-19  Result Value Ref Range   SARS Coronavirus 2 Ag Negative Negative  POCT Influenza A/B  Result Value Ref Range   Influenza A, POC Negative Negative   Influenza B, POC Negative Negative        Assessment & Plan:   Problem List Items Addressed This Visit       Respiratory   Viral URI - Primary   Based on symptoms do believe patient likely has the flu. Flu and covid  were negative in clinic but I am wondering if it is a false negative. Recommended supportive care - pt has cough so will give cough medicine - lung exam clear with no concerns for pna - gave ED precautions and work note      Relevant Orders   POC COVID-19 (Completed)   POCT Influenza A/B (Completed)     Other   Body aches   Relevant Orders   POC COVID-19 (Completed)   POCT Influenza A/B (Completed)   Other Visit Diagnoses       Acute cough       Relevant Medications   chlorpheniramine-HYDROcodone  (TUSSIONEX) 10-8 MG/5ML   Other Relevant Orders   POC COVID-19 (Completed)   POCT Influenza A/B (Completed)       Meds ordered this encounter  Medications   chlorpheniramine-HYDROcodone  (TUSSIONEX) 10-8 MG/5ML    Sig: Take 5 mLs by mouth every 12 (twelve) hours as needed for cough (cough, will cause drowsiness.).    Dispense:  115 mL    Refill:  0    Return if  symptoms worsen or fail to improve.  Josepha Nickels, DO

## 2023-08-07 NOTE — Assessment & Plan Note (Signed)
 Based on symptoms do believe patient likely has the flu. Flu and covid were negative in clinic but I am wondering if it is a false negative. Recommended supportive care - pt has cough so will give cough medicine - lung exam clear with no concerns for pna - gave ED precautions and work note

## 2023-09-29 ENCOUNTER — Ambulatory Visit: Admitting: Medical-Surgical

## 2023-09-29 ENCOUNTER — Encounter: Payer: Self-pay | Admitting: Medical-Surgical

## 2023-09-29 VITALS — BP 114/75 | HR 97 | Temp 97.9°F | Resp 20 | Ht 65.0 in | Wt 224.9 lb

## 2023-09-29 DIAGNOSIS — R198 Other specified symptoms and signs involving the digestive system and abdomen: Secondary | ICD-10-CM

## 2023-09-29 DIAGNOSIS — Z91018 Allergy to other foods: Secondary | ICD-10-CM

## 2023-09-29 MED ORDER — NORETHINDRONE ACET-ETHINYL EST 1.5-30 MG-MCG PO TABS: 1.0000 | ORAL_TABLET | Freq: Every day | ORAL | Status: AC

## 2023-09-29 MED ORDER — ONDANSETRON 4 MG PO TBDP
4.0000 mg | ORAL_TABLET | Freq: Three times a day (TID) | ORAL | 0 refills | Status: DC | PRN
Start: 2023-09-29 — End: 2024-04-10

## 2023-09-29 NOTE — Progress Notes (Unsigned)
 Established patient visit  History, exam, impression, and plan:  1. GI symptoms (Primary) Very pleasant 33 year old female presenting today with complaints of 5-6 days of GI symptoms.  On Sunday night going into Monday, she was awakened abruptly with nausea and vomiting.  She vomited in her bed once which was followed by dry heaving.  She had an episode of diarrhea.  No blood in her emesis or stool.  Endorses a low-grade fever but no chills.  No recent exposures to new foods, restaurants, medications, etc.  Her symptoms resolved however returned on Wednesday night going into Thursday.  This time she only had dry heaving for a while before this improved.  This time no fever.  Last night around 1 AM, she woke up with another episode of vomiting.  Endorses poor appetite this week along with some intermittent nausea.  No recent travel.  Has been trying to eat bland foods and small quantities and has been able to keep this down.  Exam benign with no abnormal findings.  Consider food allergy, food poisoning, or viral gastroenteritis.  Checking CBC and CMP today.  Adding Zofran every 8 hours as needed for nausea.  Continue bland foods and small quantities advancing as tolerated.  Work on staying very well-hydrated and avoid heavy greasy or spicy foods. - ondansetron (ZOFRAN-ODT) 4 MG disintegrating tablet; Take 1 tablet (4 mg total) by mouth every 8 (eight) hours as needed for nausea or vomiting.  Dispense: 20 tablet; Refill: 0 - CBC with Differential/Platelet - CMP14+EGFR  2. Food allergy Patient interested in testing for food allergies which she had done in the past.  Agreeable that this is a reasonable evaluation.  Food allergy profile ordered today. - Food Allergy Profile   Review of Systems  Constitutional:  Positive for fever and malaise/fatigue. Negative for chills.  Respiratory:  Negative for cough, shortness of breath and wheezing.   Cardiovascular:  Negative for chest pain, palpitations  and leg swelling.  Gastrointestinal:  Positive for abdominal pain, diarrhea, heartburn, nausea and vomiting. Negative for blood in stool and melena.  Genitourinary: Negative.   Skin:  Negative for rash.  Neurological:  Positive for headaches.  Endo/Heme/Allergies:  Positive for environmental allergies.    Physical Exam Vitals reviewed.  Constitutional:      General: She is not in acute distress.    Appearance: Normal appearance.  HENT:     Head: Normocephalic and atraumatic.  Cardiovascular:     Rate and Rhythm: Normal rate and regular rhythm.     Pulses: Normal pulses.     Heart sounds: Normal heart sounds. No murmur heard.    No friction rub. No gallop.  Pulmonary:     Effort: Pulmonary effort is normal. No respiratory distress.     Breath sounds: Normal breath sounds. No wheezing.  Abdominal:     General: Bowel sounds are normal. There is no distension or abdominal bruit. There are no signs of injury.     Palpations: Abdomen is soft. There is no fluid wave, hepatomegaly, splenomegaly or mass.     Tenderness: There is no abdominal tenderness. There is no guarding or rebound. Negative signs include Murphy's sign and McBurney's sign.  Skin:    General: Skin is warm and dry.  Neurological:     Mental Status: She is alert and oriented to person, place, and time.  Psychiatric:        Mood and Affect: Mood normal.  Behavior: Behavior normal.        Thought Content: Thought content normal.        Judgment: Judgment normal.    Procedures performed this visit: None.  Return if symptoms worsen or fail to improve.  __________________________________ Thayer Ohm, DNP, APRN, FNP-BC Primary Care and Sports Medicine [facility] [place]

## 2023-10-02 ENCOUNTER — Encounter: Payer: Self-pay | Admitting: Medical-Surgical

## 2023-10-07 ENCOUNTER — Encounter: Payer: Self-pay | Admitting: Medical-Surgical

## 2023-10-07 LAB — CMP14+EGFR
ALT: 15 IU/L (ref 0–32)
AST: 18 IU/L (ref 0–40)
Albumin: 4.4 g/dL (ref 3.9–4.9)
Alkaline Phosphatase: 37 IU/L — ABNORMAL LOW (ref 44–121)
BUN/Creatinine Ratio: 10 (ref 9–23)
BUN: 7 mg/dL (ref 6–20)
Bilirubin Total: 0.5 mg/dL (ref 0.0–1.2)
CO2: 22 mmol/L (ref 20–29)
Calcium: 9.7 mg/dL (ref 8.7–10.2)
Chloride: 102 mmol/L (ref 96–106)
Creatinine, Ser: 0.72 mg/dL (ref 0.57–1.00)
Globulin, Total: 2.3 g/dL (ref 1.5–4.5)
Glucose: 82 mg/dL (ref 70–99)
Potassium: 4.2 mmol/L (ref 3.5–5.2)
Sodium: 142 mmol/L (ref 134–144)
Total Protein: 6.7 g/dL (ref 6.0–8.5)
eGFR: 112 mL/min/{1.73_m2} (ref >59)

## 2023-10-07 LAB — CBC WITH DIFFERENTIAL/PLATELET
Basophils Absolute: 0.1 10*3/uL (ref 0.0–0.2)
Basos: 1 %
EOS (ABSOLUTE): 0.3 10*3/uL (ref 0.0–0.4)
Eos: 4 %
Hematocrit: 39.5 % (ref 34.0–46.6)
Hemoglobin: 13.6 g/dL (ref 11.1–15.9)
Immature Grans (Abs): 0 10*3/uL (ref 0.0–0.1)
Immature Granulocytes: 0 %
Lymphocytes Absolute: 2.3 10*3/uL (ref 0.7–3.1)
Lymphs: 35 %
MCH: 33.5 pg — ABNORMAL HIGH (ref 26.6–33.0)
MCHC: 34.4 g/dL (ref 31.5–35.7)
MCV: 97 fL (ref 79–97)
Monocytes Absolute: 0.6 10*3/uL (ref 0.1–0.9)
Monocytes: 9 %
Neutrophils Absolute: 3.2 10*3/uL (ref 1.4–7.0)
Neutrophils: 51 %
Platelets: 306 10*3/uL (ref 150–450)
RBC: 4.06 x10E6/uL (ref 3.77–5.28)
RDW: 11.9 % (ref 11.7–15.4)
WBC: 6.4 10*3/uL (ref 3.4–10.8)

## 2023-10-07 LAB — FOOD ALLERGY PROFILE
Allergen Corn, IgE: 0.33 kU/L — AB
Clam IgE: <0.1 kU/L
Codfish IgE: <0.1 kU/L
Egg White IgE: <0.1 kU/L
Milk IgE: <0.1 kU/L
Peanut IgE: <0.1 kU/L
Scallop IgE: <0.1 kU/L
Sesame Seed IgE: <0.1 kU/L
Shrimp IgE: <0.1 kU/L
Soybean IgE: <0.1 kU/L
Walnut IgE: <0.1 kU/L
Wheat IgE: <0.1 kU/L

## 2023-10-24 ENCOUNTER — Ambulatory Visit: Admitting: Family Medicine

## 2023-10-24 VITALS — BP 122/81 | HR 84 | Temp 98.4°F | Ht 65.0 in | Wt 225.0 lb

## 2023-10-24 DIAGNOSIS — J029 Acute pharyngitis, unspecified: Secondary | ICD-10-CM

## 2023-10-24 DIAGNOSIS — J0100 Acute maxillary sinusitis, unspecified: Secondary | ICD-10-CM | POA: Diagnosis not present

## 2023-10-24 LAB — POCT INFLUENZA A/B
Influenza A, POC: NEGATIVE
Influenza B, POC: NEGATIVE

## 2023-10-24 LAB — POC COVID19 BINAXNOW: SARS Coronavirus 2 Ag: NEGATIVE

## 2023-10-24 LAB — POCT RAPID STREP A (OFFICE): Rapid Strep A Screen: NEGATIVE

## 2023-10-24 MED ORDER — DOXYCYCLINE HYCLATE 100 MG PO TABS: 100.0000 mg | ORAL_TABLET | Freq: Two times a day (BID) | ORAL | 0 refills | Status: DC

## 2023-10-24 MED ORDER — PREDNISONE 20 MG PO TABS: 20.0000 mg | ORAL_TABLET | Freq: Two times a day (BID) | ORAL | 0 refills | Status: AC

## 2023-10-24 NOTE — Patient Instructions (Signed)

## 2023-10-24 NOTE — Assessment & Plan Note (Signed)
 Treating agressively with steroid burst and course of doxycycline .  Continue supportive care.  Contact clinic if not improving.

## 2023-10-24 NOTE — Progress Notes (Signed)
 Joy Rogers - 33 y.o. female MRN [id number]  Date of birth: 07-Sep-1990  Subjective Chief Complaint  Patient presents with   Cough   Sinusitis   Sore Throat    HPI Joy Rogers is a 33 y.o. female here today with complaint of possible sinus infection.   She has had scratchy throat, sinus pressure, fever and fatigue.  Symptoms started about 4 days ago and have worsened.  She denies significant lower respiratory symptoms including cough, dyspnea or wheezing.  She has used dayquil/nyquil to help with symptoms.    ROS:  A comprehensive ROS was completed and negative except as noted per HPI  Allergies  Allergen Reactions   Morphine Sulfate Other (See Comments)    Causes anaphylaxis in mom   Cephalexin Hives and Nausea Only   Clindamycin/Lincomycin Hives, Rash and Other (See Comments)    Abd Pain   Corn-Containing Products Other (See Comments)    Bloating, flatulence   Egg White (Egg Protein) Other (See Comments)    GI upset   Latex Itching and Rash    Past Medical History:  Diagnosis Date   Anxiety    Asthma    Ovarian cyst     Past Surgical History:  Procedure Laterality Date   LAMINECTOMY N/A 11/10/2021   Partial Disectomy on L5 & S1   TONSILLECTOMY     WISDOM TOOTH EXTRACTION      Social History   Socioeconomic History   Marital status: Single    Spouse name: Not on file   Number of children: Not on file   Years of education: Not on file   Highest education level: Bachelor's degree (e.g., BA, AB, BS)  Occupational History   Not on file  Tobacco Use   Smoking status: Never    Passive exposure: Never   Smokeless tobacco: Never  Vaping Use   Vaping status: Every Day   Substances: Nicotine  Substance and Sexual Activity   Alcohol use: Yes    Alcohol/week: 10.0 standard drinks of alcohol    Types: 10 Standard drinks or equivalent per week    Comment: Weekends   Drug use: Never   Sexual activity: Yes    Partners: Female, Female    Birth  control/protection: Condom  Other Topics Concern   Not on file  Social History Narrative   Not on file   Social Drivers of Health   Financial Resource Strain: Low Risk  (10/24/2023)   Overall Financial Resource Strain (CARDIA)    Difficulty of Paying Living Expenses: Not very hard  Food Insecurity: No Food Insecurity (10/24/2023)   Hunger Vital Sign    Worried About Running Out of Food in the Last Year: Never true    Ran Out of Food in the Last Year: Never true  Transportation Needs: No Transportation Needs (10/24/2023)   PRAPARE - Administrator, Civil Service (Medical): No    Lack of Transportation (Non-Medical): No  Physical Activity: Insufficiently Active (10/24/2023)   Exercise Vital Sign    Days of Exercise per Week: 2 days    Minutes of Exercise per Session: 20 min  Stress: Stress Concern Present (10/24/2023)   [employer] of Occupational Health - Occupational Stress Questionnaire    Feeling of Stress : To some extent  Social Connections: Moderately Integrated (10/24/2023)   Social Connection and Isolation Panel [NHANES]    Frequency of Communication with Friends and Family: Twice a week    Frequency of Social Gatherings with Friends and  Family: Twice a week    Attends Religious Services: Never    Active Member of Clubs or Organizations: Yes    Attends Engineer, structural: More than 4 times per year    Marital Status: Living with partner    Family History  Problem Relation Age of Onset   Hypertension Mother    Lung cancer Father    Breast cancer Maternal Grandmother    Breast cancer Maternal Aunt    Lung cancer Paternal Uncle     Health Maintenance  Topic Date Due   HIV Screening  Never done   Hepatitis C Screening  Never done   COVID-19 Vaccine (3 - 2024-25 season) 02/26/2023   Cervical Cancer Screening (HPV/Pap Cotest)  09/16/2023   INFLUENZA VACCINE  01/26/2024   DTaP/Tdap/Td (2 - Tdap) 07/29/2026   HPV VACCINES  Aged Out    Meningococcal B Vaccine  Aged Out     ----------------------------------------------------------------------------------------------------------------------------------------------------------------------------------------------------------------- Physical Exam There were no vitals taken for this visit.  Physical Exam Constitutional:      Appearance: Normal appearance.  HENT:     Head: Normocephalic and atraumatic.     Ears:     Comments: Serous effusion bilaterally.     Nose: Congestion present.     Comments: Maxillary sinus ttp bilaterally.  Eyes:     General: No scleral icterus. Cardiovascular:     Rate and Rhythm: Normal rate and regular rhythm.  Pulmonary:     Effort: Pulmonary effort is normal.     Breath sounds: Normal breath sounds.  Lymphadenopathy:     Cervical: Cervical adenopathy present.  Neurological:     Mental Status: She is alert.     ------------------------------------------------------------------------------------------------------------------------------------------------------------------------------------------------------------------- Assessment and Plan  Acute maxillary sinusitis Treating agressively with steroid burst and course of doxycycline .  Continue supportive care.  Contact clinic if not improving.    Meds ordered this encounter  Medications   predniSONE  (DELTASONE ) 20 MG tablet    Sig: Take 1 tablet (20 mg total) by mouth 2 (two) times daily with a meal for 5 days.    Dispense:  10 tablet    Refill:  0   doxycycline  (VIBRA -TABS) 100 MG tablet    Sig: Take 1 tablet (100 mg total) by mouth 2 (two) times daily.    Dispense:  20 tablet    Refill:  0    No follow-ups on file.

## 2023-11-02 ENCOUNTER — Encounter: Payer: Self-pay | Admitting: Family Medicine

## 2023-11-23 ENCOUNTER — Encounter: Payer: Self-pay | Admitting: Family Medicine

## 2024-01-26 ENCOUNTER — Ambulatory Visit: Admitting: Urgent Care

## 2024-02-27 ENCOUNTER — Encounter: Payer: Self-pay | Admitting: Sports Medicine

## 2024-04-10 ENCOUNTER — Encounter: Payer: Self-pay | Admitting: Family Medicine

## 2024-04-10 ENCOUNTER — Ambulatory Visit: Admitting: Family Medicine

## 2024-04-10 VITALS — BP 128/79 | HR 74 | Temp 98.9°F | Ht 65.0 in | Wt 222.1 lb

## 2024-04-10 DIAGNOSIS — J018 Other acute sinusitis: Secondary | ICD-10-CM | POA: Diagnosis not present

## 2024-04-10 LAB — POC SOFIA 2 FLU + SARS ANTIGEN FIA
Influenza A, POC: NEGATIVE
Influenza B, POC: NEGATIVE
SARS Coronavirus 2 Ag: NEGATIVE

## 2024-04-10 MED ORDER — FLUCONAZOLE 150 MG PO TABS: 150.0000 mg | ORAL_TABLET | Freq: Once | ORAL | 0 refills | Status: AC

## 2024-04-10 MED ORDER — DOXYCYCLINE HYCLATE 100 MG PO TABS: 100.0000 mg | ORAL_TABLET | Freq: Two times a day (BID) | ORAL | 0 refills | Status: DC

## 2024-04-10 NOTE — Progress Notes (Signed)
 Acute Office Visit  Subjective:     Patient ID: Joy Rogers, female    DOB: 08-23-1990, 33 y.o.   MRN: [id number]  Chief Complaint  Patient presents with   Sinusitis    Pt reports that her sxs started 5 days ago. She stated that it started with a headache and the following day her sxs became worse     HPI Patient is in today for HA and pressure that started Sat night (5 days ago), by next day felt congested.  Then ran fever on Monday night and last night, Tues night.  Has been feeling progressive worse and congestion. Mucous is mostly clear and occ yellow.  Some seasonal allergies.  Does use saline nasal saline mist from time to time also recently started taking some DayQuil and NyQuil in the last couple days and that does help take the edge off.  Typically uses Flonase as well.   ROS      Objective:    BP 128/79   Pulse 74   Temp 98.9 F (37.2 C) (Oral)   Ht 5' 5 (1.651 m)   Wt 222 lb 1.9 oz (100.8 kg)   SpO2 98%   BMI 36.96 kg/m    Physical Exam Constitutional:      Appearance: Normal appearance.  HENT:     Head: Normocephalic and atraumatic.     Right Ear: Tympanic membrane, ear canal and external ear normal. There is no impacted cerumen.     Left Ear: Tympanic membrane, ear canal and external ear normal. There is no impacted cerumen.     Nose: Nose normal.     Mouth/Throat:     Pharynx: Oropharynx is clear.  Eyes:     Conjunctiva/sclera: Conjunctivae normal.  Cardiovascular:     Rate and Rhythm: Normal rate and regular rhythm.  Pulmonary:     Effort: Pulmonary effort is normal.     Breath sounds: Normal breath sounds.  Musculoskeletal:     Cervical back: Neck supple. No tenderness.  Lymphadenopathy:     Cervical: No cervical adenopathy.  Skin:    General: Skin is warm and dry.  Neurological:     Mental Status: She is alert and oriented to person, place, and time.  Psychiatric:        Mood and Affect: Mood normal.     Results for orders placed or  performed in visit on 04/10/24  POC SOFIA 2 FLU + SARS ANTIGEN FIA  Result Value Ref Range   Influenza A, POC Negative Negative   Influenza B, POC Negative Negative   SARS Coronavirus 2 Ag Negative Negative        Assessment & Plan:   Problem List Items Addressed This Visit   None Visit Diagnoses       Acute non-recurrent sinusitis of other sinus    -  Primary   Relevant Medications   doxycycline  (VIBRA -TABS) 100 MG tablet   fluconazole  (DIFLUCAN ) 150 MG tablet   Other Relevant Orders   POC SOFIA 2 FLU + SARS ANTIGEN FIA (Completed)      Cute sinusitis-did discuss that it is still early due to officially call this a bacterial infection most likely allergy  and/or viral related.  Negative for COVID and flu today.  Continue with nasal saline spray recommend a trial of irrigation.  Okay to continue with DayQuil and NyQuil and if not better in a couple days or feels like she suddenly getting worse then okay to start the antibiotics.  She also requested Diflucan  to take as she typically gets a yeast infection secondary to antibiotics.  Meds ordered this encounter  Medications   doxycycline  (VIBRA -TABS) 100 MG tablet    Sig: Take 1 tablet (100 mg total) by mouth 2 (two) times daily.    Dispense:  14 tablet    Refill:  0   fluconazole  (DIFLUCAN ) 150 MG tablet    Sig: Take 1 tablet (150 mg total) by mouth once for 1 dose.    Dispense:  2 tablet    Refill:  0    Return if symptoms worsen or fail to improve.  Dorothyann Byars, MD

## 2024-04-16 ENCOUNTER — Encounter: Payer: Self-pay | Admitting: Medical-Surgical

## 2024-04-16 NOTE — Telephone Encounter (Signed)
 Attempted call to patient. Left a voice mail message requesting a return call.

## 2024-04-16 NOTE — Telephone Encounter (Signed)
 Spoke with patient. Connected to front desk to schedule TOC to Zada Palin, NP

## 2024-04-26 ENCOUNTER — Ambulatory Visit: Admitting: Medical-Surgical

## 2024-04-26 VITALS — BP 125/82 | HR 88 | Resp 20 | Ht 65.0 in | Wt 223.0 lb

## 2024-04-26 DIAGNOSIS — R0789 Other chest pain: Secondary | ICD-10-CM

## 2024-04-26 DIAGNOSIS — Z7689 Persons encountering health services in other specified circumstances: Secondary | ICD-10-CM | POA: Diagnosis not present

## 2024-04-26 DIAGNOSIS — F419 Anxiety disorder, unspecified: Secondary | ICD-10-CM | POA: Diagnosis not present

## 2024-04-26 DIAGNOSIS — R911 Solitary pulmonary nodule: Secondary | ICD-10-CM | POA: Diagnosis not present

## 2024-04-26 MED ORDER — ALBUTEROL SULFATE HFA 108 (90 BASE) MCG/ACT IN AERS: 1.0000 | INHALATION_SPRAY | Freq: Four times a day (QID) | RESPIRATORY_TRACT | 2 refills | Status: AC | PRN

## 2024-04-26 NOTE — Progress Notes (Signed)
        Established patient visit   History of Present Illness   Discussed the use of AI scribe software for clinical note transcription with the patient, who gave verbal consent to proceed.  History of Present Illness   Joy Rogers is a 33 year old female who presents for follow-up of a lung nodule and evaluation of chest pain.  Pulmonary nodule surveillance - A 6mm lung nodule was discovered two years ago during an urgent care visit. - A follow-up scan was conducted and further monitoring was deemed unnecessary at that time. - She prefers to repeat a follow up scan for surveillance and to ensure stability.  Chest pain and dyspnea - Intermittent chest pain for over a month. - Pain occurs throughout the day and lasts two to three minutes per episode. - Pain is not reproducible on palpation. - Episodes sometimes cause shortness of breath, especially during panic and deep breathing attempts.  Sinus infection - Recently prescribed doxycycline  for a sinus infection. - Has almost completed the course of doxycycline .  Generalized anxiety and sleep disturbance - Generalized anxiety affects sleep and contributes to fatigue. - Manages anxiety without medication. - Considering returning to therapy.     Physical Exam   Physical Exam Vitals reviewed.  Constitutional:      General: She is not in acute distress.    Appearance: Normal appearance. She is not ill-appearing.  HENT:     Head: Normocephalic and atraumatic.  Cardiovascular:     Rate and Rhythm: Normal rate and regular rhythm.     Pulses: Normal pulses.     Heart sounds: Normal heart sounds. No murmur heard.    No friction rub. No gallop.  Pulmonary:     Effort: Pulmonary effort is normal. No respiratory distress.     Breath sounds: Normal breath sounds. No wheezing.  Skin:    General: Skin is warm and dry.  Neurological:     Mental Status: She is alert and oriented to person, place, and time.  Psychiatric:         Mood and Affect: Mood normal.        Behavior: Behavior normal.        Thought Content: Thought content normal.        Judgment: Judgment normal.    Assessment & Plan   1. Encounter to establish care (Primary) Reviewed available information and discussed care concerns with patient.   2. Pulmonary nodule Pulmonary nodule showed stability on last imaging two years ago. Was originally told to have follow up testing every two years but then was told no follow up needed. Per patient preference, ordering CT chest w/o contrast to reevaluate.  - CT Chest Wo Contrast; Future  3. Anxiety Long history of anxiety with significant health related anxiety. Prior experience with medication left her feeling not herself and she is not currently interested in starting anything. Referral placed to get her connected with a new counselor.  - Ambulatory referral to Behavioral Health  4. Atypical chest pain Intermittent chest pain with differential including musculoskeletal, reflux, pulmonary, cardiac, and anxiety. Non-musculoskeletal origin likely. - Ordered CT chest without contrast to evaluate for pulmonary, esophageal, and cardiac causes. - Offered EKG to assess heart rhythm, declined today but may consider in the future.  Follow up   Return in about 7 weeks (around 06/14/2024) for general follow up. __________________________________ Zada FREDRIK Palin, DNP, APRN, FNP-BC Primary Care and Sports Medicine [facility]

## 2024-05-14 ENCOUNTER — Encounter: Payer: Self-pay | Admitting: Medical-Surgical

## 2024-05-15 ENCOUNTER — Ambulatory Visit (INDEPENDENT_AMBULATORY_CARE_PROVIDER_SITE_OTHER): Payer: Self-pay | Admitting: Licensed Clinical Social Worker

## 2024-05-15 DIAGNOSIS — F411 Generalized anxiety disorder: Secondary | ICD-10-CM

## 2024-05-15 DIAGNOSIS — F321 Major depressive disorder, single episode, moderate: Secondary | ICD-10-CM | POA: Diagnosis not present
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
## 2024-05-21 ENCOUNTER — Ambulatory Visit (INDEPENDENT_AMBULATORY_CARE_PROVIDER_SITE_OTHER)

## 2024-05-21 DIAGNOSIS — R911 Solitary pulmonary nodule: Secondary | ICD-10-CM

## 2024-05-29 ENCOUNTER — Ambulatory Visit: Admitting: Licensed Clinical Social Worker

## 2024-05-29 ENCOUNTER — Encounter: Payer: Self-pay | Admitting: Licensed Clinical Social Worker

## 2024-05-29 DIAGNOSIS — F411 Generalized anxiety disorder: Secondary | ICD-10-CM

## 2024-05-29 DIAGNOSIS — F321 Major depressive disorder, single episode, moderate: Secondary | ICD-10-CM

## 2024-05-29 NOTE — Progress Notes (Addendum)
 [facility] Counselor/Therapist Progress Note  Patient ID: Leanora Murin, MRN: [id number]   Date: 05/29/24  Time Spent: 4:02  pm - 5:09 pm : 67 Minutes  Treatment Type: Individual Therapy.  Reported Symptoms: Tearfulness in feelings of being overwhelmed daily. Affect depressed and fatigued. No SI/HI reported. Thought process logical and goal-directed. Engagement appropriate and insight  Mental Status Exam: Appearance:  Well Groomed     Behavior: Appropriate and Sharing  Motor: Normal  Speech/Language:  Normal Rate  Affect: Appropriate  Mood: normal  Thought process: normal  Thought content:   WNL  Sensory/Perceptual disturbances:   WNL  Orientation: oriented to person, place, and time/date  Attention: Good  Concentration: Good  Memory: WNL  Fund of knowledge:  Good  Insight:   Good  Judgment:  Good  Impulse Control: Good   Risk Assessment: Danger to Self:  No Self-injurious Behavior: No Danger to Others: No Duty to Warn:no Physical Aggression / Violence:No  Access to Firearms a concern: No  Gang Involvement:No   Subjective:   Edmonia Puna participated from home, via video and consented to treatment. Therapist participated from home office. I discussed the limitations of evaluation and management by telemedicine and the availability of in person appointments. The patient expressed understanding and agreed to proceed. Sincere reviewed the events of the past week. 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 She describes marked anhedonia, social withdrawal, and spending long periods doing nothing. Reports financial stress and pressure from school leadership to maintain high performance despite student apathy. States she has lost friends and missed events due to burnout. Expressed desire to be more intentional in maintaining remaining friendships. Reports relationship strain due to  anxiety and her partners tendency to fix problems; wants to improve communication and emotional processing. Patient expressed understanding of treatment plan and agreement to proceed.     We reviewed numerous treatment approaches including CBT and Solution focused therapy. Psych-education regarding the Antinette's diagnosis of No diagnosis found. was provided during the session. We discussed Emmaclaire Gracie's goals treatment goals which include The patient will develop and apply coping strategies to manage anhedonia, stress, and anxiety, strengthen supportive relationships, and increase emotional clarity and communication with her partner and friends.SABRA Edmonia Puna provided verbal approval of the treatment plan.   Interventions: Psycho-education & Goal Setting.   Diagnosis:  GAD Moderate MDepression w/o prior episode  Psychiatric Treatment: No , N/A   Treatment Plan:  Client Abilities/Strengths Antanasia is open to sessions.    Support System: Media Planner friend group  Client Treatment Preferences Hybrid  Treatment Level Weekly  Symptoms  anxious   (Status: maintained) Overwhelmed/tearful daily   (Status: maintained)  Goals:   Deriyah agreed to set goal of  The patient will develop and apply coping strategies to manage anhedonia, stress, and anxiety, strengthen supportive relationships, and increase emotional clarity and communication with her partner and friends. Symptoms consistent with Major Depressive Disorder, moderate, with burnout contributing to functional impairment. Psychosocial stressors include occupational strain, financial pressure, and relationship challenges. Patient motivated for treatment and open to skill development. Begin 8-week treatment plan focused on mood regulation, behavioral activation, burnout management, and improving communication skills. Assign initial tasks: identify top depressive triggers; begin scheduling 1 meaningful or pleasurable  activity this week; identify 2 friendships for intentional reconnection. Introduce anxiety-management strategies for relationship communication. Continue weekly psychotherapy sessions to monitor mood and progress, adjust goals as needed.  Treatment plan  signed and available on s-drive:  Yes    Target Date: 07/24/24 Frequency: Weekly  Progress: 0 Modality: individual    Therapist will provide referrals for additional resources as appropriate.  Therapist will provide psycho-education regarding Karolyna's diagnosis and corresponding treatment approaches and interventions. Licensed Clinical Mental Health Counselor, Tawni Louder, [facility] will support the patient's ability to achieve the goals identified. will employ CBT, BA, Problem-solving, Solution Focused, Mindfulness,  coping skills, & other evidenced-based practices will be used to promote progress towards healthy functioning to help manage decrease symptoms associated with her diagnosis.   Reduce overall level, frequency, and intensity of the feelings of depression, anxiety and panic evidenced by decreased feelings of extreme overwhelm/withdrawal from 6 to 7 days/week to 0 to 1 days/week per client report for at least 3 consecutive months. Verbally express understanding of the relationship between feelings of depression, anxiety and their impact on thinking patterns and behaviors. Verbalize an understanding of the role that distorted thinking plays in creating fears, excessive worry, and ruminations.    [facility] participated in the creation of the treatment plan)    Tawni Louder, LCMHC

## 2024-05-30 ENCOUNTER — Ambulatory Visit: Payer: Self-pay | Admitting: Medical-Surgical

## 2024-06-12 ENCOUNTER — Ambulatory Visit: Admitting: Licensed Clinical Social Worker

## 2024-06-12 DIAGNOSIS — F321 Major depressive disorder, single episode, moderate: Secondary | ICD-10-CM | POA: Diagnosis not present

## 2024-06-12 DIAGNOSIS — F411 Generalized anxiety disorder: Secondary | ICD-10-CM | POA: Diagnosis not present

## 2024-06-12 NOTE — Progress Notes (Addendum)
 [facility] Counselor/Therapist Progress Note  Patient ID: Joy Rogers, MRN: [id number]    Date: 06/12/2024  Time Spent: 4:02  pm - 5:03 pm : 61 Minutes  Treatment Type: Individual Therapy.  Reported Symptoms: Affect tearful and anxious; mood depressed. Thought process rigid and fixated, with black-and-white and catastrophic thinking. Highly reluctant to consider change; focused on rumination and emotional distress rather than problem-solving.  Mental Status Exam: Appearance:  Casual     Behavior: Appropriate and Sharing  Motor: Normal  Speech/Language:  Normal Rate  Affect: Tearful  Mood: depressed and sad  Thought process: goal directed  Thought content:   WNL  Sensory/Perceptual disturbances:   WNL  Orientation: oriented to person, place, and time/date  Attention: Good  Concentration: Good  Memory: WNL  Fund of knowledge:  Good  Insight:   Good  Judgment:  Good  Impulse Control: Good   Risk Assessment: Danger to Self:  No Self-injurious Behavior: No Danger to Others: No Duty to Warn:no Physical Aggression / Violence:No  Access to Firearms a concern: No  Gang Involvement:No   Subjective:   Joy Rogers participated from home, via video, and consented to treatment. I discussed the limitations of evaluation and management by telemedicine and the availability of in person appointments. The patient expressed understanding and agreed to proceed.  Therapist participated from home office.  Gilbert reviewed the events of the past week. 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 She describes feeling crippled by fear and rigid beliefs that she must remain in one career path (college ? job ? retire). Reports pervasive negative thinking and catastrophizing all alternatives discussed. Since last session, she spoke with her partner and reports full support should she lose or leave her job due to  stress.     Interventions: Insight-Oriented CBT  Diagnosis:  Generalized anxiety disorder MDD  Psychiatric Treatment: No , N/A  Treatment Plan:  Client Abilities/Strengths Joy Rogers is open to sessions.    Support System: Partner and Small friend group   Client Treatment Preferences Hybrid  Client Statement of Needs Joy Rogers would like to develop and apply coping strategies to manage anhedonia, stress, and anxiety, strengthen supportive relationships, and increase emotional clarity and communication with her partner and friends.   Treatment Level Weekly  Symptoms  tearful   (Status: declined) Depressed/negative thought patterns   (Status: declined)  Goals:   Joy Rogers Maladaptive core beliefs and cognitive distortions contributing to anxiety and depressive symptoms. Fear of uncertainty and loss of identity tied to employment. Limited cognitive flexibility at this time. Insight emerging but resistance remains high. No safety concerns reported. Assigned homework includes cognitive reframing of negative thoughts through the use of spoken affirmations and intentional practice identifying hope-based alternatives; patient was encouraged to conceptualize potential career change as an opportunity for growth rather than a fixed negative outcome, with discussion of the inherent 50/50 uncertainty of outcomes. Patient will have two weeks off during the holidays to rest and decompress, plans to resume therapy and reassess outlook upon return to school and treatment in January 2026, with the next session scheduled in three weeks.   Target Date: 07/24/24 Frequency: Weekly  Progress: 0 Modality: individual    Therapist will provide referrals for additional resources as appropriate.  Therapist will provide psycho-education regarding Joy Rogers's diagnosis and corresponding treatment approaches and interventions. Licensed Clinical Mental Health Counselor, Tawni Louder, [facility] will support  the patient's ability to achieve the goals identified. will employ CBT,  BA, Problem-solving, Solution Focused, Mindfulness,  coping skills, & other evidenced-based practices will be used to promote progress towards healthy functioning to help manage decrease symptoms associated with her diagnosis.   Reduce overall level, frequency, and intensity of the feelings of depression, anxiety and panic evidenced by decreased extreme overwhelm/withdrawal  from 6 to 7 days/week to 0 to 1 days/week per client report for at least 3 consecutive months. Verbally express understanding of the relationship between feelings of depression, anxiety and their impact on thinking patterns and behaviors. Verbalize an understanding of the role that distorted thinking plays in creating fears, excessive worry, and ruminations.  Joy Rogers Joy Rogers participated in the creation of the treatment plan)   Tawni Louder, LCMHC

## 2024-06-17 ENCOUNTER — Ambulatory Visit: Admitting: Medical-Surgical

## 2024-06-17 ENCOUNTER — Encounter: Payer: Self-pay | Admitting: Medical-Surgical

## 2024-06-17 VITALS — BP 127/84 | HR 63 | Temp 98.3°F | Resp 16 | Ht 65.0 in | Wt 223.0 lb

## 2024-06-17 DIAGNOSIS — E6609 Other obesity due to excess calories: Secondary | ICD-10-CM

## 2024-06-17 DIAGNOSIS — Z6837 Body mass index (BMI) 37.0-37.9, adult: Secondary | ICD-10-CM

## 2024-06-17 DIAGNOSIS — F411 Generalized anxiety disorder: Secondary | ICD-10-CM

## 2024-06-17 DIAGNOSIS — E66812 Obesity, class 2: Secondary | ICD-10-CM | POA: Insufficient documentation

## 2024-06-17 NOTE — Patient Instructions (Signed)
 First line mood medications:  SSRIs: Zoloft (sertraline) Lexapro (escitalopram) Prozac (fluoxetine) Trintellix Viibryd  Anxiety only: Buspar (buspirone)  Mainly depression: Wellbutrin (bupropion)

## 2024-06-17 NOTE — Progress Notes (Signed)
" ° °       Established patient visit   History of Present Illness   Discussed the use of AI scribe software for clinical note transcription with the patient, who gave verbal consent to proceed.  History of Present Illness   Joy Rogers is a 33 year old female who presents with worsening anxiety and mood challenges.  Anxiety and mood disturbance - Worsening anxiety over the past few weeks, characterized by increased nervousness - Symptoms remain significant despite completing three counseling sessions, which are perceived as helpful but emotionally challenging - Mood symptoms closely linked to anxiety and body image concerns - Past thoughts of self-harm during periods of severe symptoms, but no current self-harm ideation - Considering initiation of pharmacologic therapy for symptom management  Body image concerns and weight management - Longstanding body image issues with increased anxiety about appearance over the past six months - Concern regarding potential weight gain from psychiatric medications - Belief that current birth control may contribute to weight gain - Increased walking attempted for weight loss without success, leading to heightened frustration - Body image concerns closely tied to mood and anxiety symptoms       Physical Exam   Physical Exam Vitals reviewed.  Constitutional:      General: She is not in acute distress.    Appearance: Normal appearance.  HENT:     Head: Normocephalic and atraumatic.  Cardiovascular:     Rate and Rhythm: Normal rate and regular rhythm.  Pulmonary:     Effort: Pulmonary effort is normal. No respiratory distress.  Skin:    General: Skin is warm and dry.  Neurological:     Mental Status: She is alert and oriented to person, place, and time.  Psychiatric:        Mood and Affect: Mood normal.        Behavior: Behavior normal.        Thought Content: Thought content normal.        Judgment: Judgment normal.    Assessment &  Plan     Generalized anxiety disorder Increased anxiety possibly exacerbated by counseling. Discussed Buspar and potential side effects. Ongoing mood challenges despite counseling. Discussed first-line medications, preferring Lexapro due to lower weight gain risk. Explained starting doses and titration schedule. - Continue counseling as scheduled. - Provided list of medication options for review and decision.  Obesity Discussed weight loss options including GLP-1 agonists, their benefits, side effects, and cost issues. Consideration of compounded GLP-1 agonists. - Consider compounded GLP-1 agonists from [facility] in [place]. - Discuss with pharmacy about using [employer] card for payment. - Use backup contraception during the first week of dose changes if on birth control.     Follow up   Return if symptoms worsen or fail to improve. __________________________________ Zada FREDRIK Palin, DNP, APRN, FNP-BC Primary Care and Sports Medicine [facility] "

## 2024-07-04 ENCOUNTER — Encounter: Payer: Self-pay | Admitting: Licensed Clinical Social Worker

## 2024-07-04 ENCOUNTER — Ambulatory Visit: Admitting: Licensed Clinical Social Worker
